# Patient Record
Sex: Male | Born: 1940 | Race: White | Hispanic: No | Marital: Married | Smoking: Former smoker
Health system: Southern US, Community
[De-identification: ages and names within clinical notes are randomized; demographics above are authoritative.]

## PROBLEM LIST (undated history)

## (undated) DIAGNOSIS — R197 Diarrhea, unspecified: Secondary | ICD-10-CM

## (undated) DIAGNOSIS — M549 Dorsalgia, unspecified: Secondary | ICD-10-CM

## (undated) DIAGNOSIS — F419 Anxiety disorder, unspecified: Secondary | ICD-10-CM

## (undated) DIAGNOSIS — G5139 Clonic hemifacial spasm, unspecified: Secondary | ICD-10-CM

## (undated) DIAGNOSIS — Z8719 Personal history of other diseases of the digestive system: Secondary | ICD-10-CM

## (undated) DIAGNOSIS — N400 Enlarged prostate without lower urinary tract symptoms: Secondary | ICD-10-CM

## (undated) DIAGNOSIS — I10 Essential (primary) hypertension: Secondary | ICD-10-CM

## (undated) DIAGNOSIS — K219 Gastro-esophageal reflux disease without esophagitis: Secondary | ICD-10-CM

## (undated) DIAGNOSIS — G8929 Other chronic pain: Secondary | ICD-10-CM

## (undated) DIAGNOSIS — G43909 Migraine, unspecified, not intractable, without status migrainosus: Secondary | ICD-10-CM

## (undated) HISTORY — PX: OTHER SURGICAL HISTORY: SHX169

## (undated) HISTORY — PX: ESOPHAGOGASTRODUODENOSCOPY: SHX1529

## (undated) HISTORY — DX: Benign prostatic hyperplasia without lower urinary tract symptoms: N40.0

## (undated) HISTORY — PX: TONSILLECTOMY: SUR1361

## (undated) HISTORY — PX: COLONOSCOPY: SHX174

## (undated) HISTORY — DX: Essential (primary) hypertension: I10

---

## 2000-11-03 ENCOUNTER — Ambulatory Visit (HOSPITAL_COMMUNITY): Admission: RE | Admit: 2000-11-03 | Discharge: 2000-11-03 | Payer: Self-pay | Admitting: Neurosurgery

## 2000-11-03 ENCOUNTER — Encounter: Payer: Self-pay | Admitting: Neurosurgery

## 2003-07-12 ENCOUNTER — Ambulatory Visit (HOSPITAL_COMMUNITY): Admission: RE | Admit: 2003-07-12 | Discharge: 2003-07-12 | Payer: Self-pay | Admitting: Neurology

## 2003-08-03 ENCOUNTER — Ambulatory Visit (HOSPITAL_COMMUNITY): Admission: RE | Admit: 2003-08-03 | Discharge: 2003-08-03 | Payer: Self-pay | Admitting: Neurology

## 2015-04-24 ENCOUNTER — Encounter (HOSPITAL_COMMUNITY): Payer: Self-pay | Admitting: Emergency Medicine

## 2015-04-24 ENCOUNTER — Telehealth: Payer: Self-pay | Admitting: General Practice

## 2015-04-24 ENCOUNTER — Emergency Department (HOSPITAL_COMMUNITY)
Admission: EM | Admit: 2015-04-24 | Discharge: 2015-04-24 | Disposition: A | Discharge status: Home / Self Care | Payer: Medicare Other | Attending: Emergency Medicine | Admitting: Emergency Medicine

## 2015-04-24 ENCOUNTER — Emergency Department (HOSPITAL_COMMUNITY): Payer: Medicare Other

## 2015-04-24 DIAGNOSIS — Z8679 Personal history of other diseases of the circulatory system: Secondary | ICD-10-CM | POA: Insufficient documentation

## 2015-04-24 DIAGNOSIS — Z87891 Personal history of nicotine dependence: Secondary | ICD-10-CM | POA: Diagnosis not present

## 2015-04-24 DIAGNOSIS — Z8669 Personal history of other diseases of the nervous system and sense organs: Secondary | ICD-10-CM | POA: Insufficient documentation

## 2015-04-24 DIAGNOSIS — R1010 Upper abdominal pain, unspecified: Secondary | ICD-10-CM | POA: Diagnosis present

## 2015-04-24 DIAGNOSIS — R109 Unspecified abdominal pain: Secondary | ICD-10-CM | POA: Insufficient documentation

## 2015-04-24 HISTORY — DX: Migraine, unspecified, not intractable, without status migrainosus: G43.909

## 2015-04-24 HISTORY — DX: Clonic hemifacial spasm, unspecified: G51.39

## 2015-04-24 LAB — CBC WITH DIFFERENTIAL/PLATELET
BASOS ABS: 0 10*3/uL (ref 0.0–0.1)
BASOS PCT: 0 %
EOS PCT: 2 %
Eosinophils Absolute: 0.2 10*3/uL (ref 0.0–0.7)
HCT: 45.2 % (ref 39.0–52.0)
Hemoglobin: 15.7 g/dL (ref 13.0–17.0)
Lymphocytes Relative: 10 %
Lymphs Abs: 1.4 10*3/uL (ref 0.7–4.0)
MCH: 32.6 pg (ref 26.0–34.0)
MCHC: 34.7 g/dL (ref 30.0–36.0)
MCV: 94 fL (ref 78.0–100.0)
MONO ABS: 2 10*3/uL — AB (ref 0.1–1.0)
MONOS PCT: 15 %
Neutro Abs: 9.6 10*3/uL — ABNORMAL HIGH (ref 1.7–7.7)
Neutrophils Relative %: 73 %
PLATELETS: 224 10*3/uL (ref 150–400)
RBC: 4.81 MIL/uL (ref 4.22–5.81)
RDW: 12.9 % (ref 11.5–15.5)
WBC: 13.2 10*3/uL — ABNORMAL HIGH (ref 4.0–10.5)

## 2015-04-24 LAB — COMPREHENSIVE METABOLIC PANEL
ALBUMIN: 3.6 g/dL (ref 3.5–5.0)
ALT: 16 U/L — ABNORMAL LOW (ref 17–63)
ANION GAP: 8 (ref 5–15)
AST: 21 U/L (ref 15–41)
Alkaline Phosphatase: 60 U/L (ref 38–126)
BILIRUBIN TOTAL: 1.1 mg/dL (ref 0.3–1.2)
BUN: 14 mg/dL (ref 6–20)
CO2: 25 mmol/L (ref 22–32)
Calcium: 9 mg/dL (ref 8.9–10.3)
Chloride: 101 mmol/L (ref 101–111)
Creatinine, Ser: 0.75 mg/dL (ref 0.61–1.24)
GFR calc Af Amer: >60 mL/min (ref >60)
Glucose, Bld: 103 mg/dL — ABNORMAL HIGH (ref 65–99)
POTASSIUM: 3.8 mmol/L (ref 3.5–5.1)
Sodium: 134 mmol/L — ABNORMAL LOW (ref 135–145)
TOTAL PROTEIN: 7.3 g/dL (ref 6.5–8.1)

## 2015-04-24 LAB — LIPASE, BLOOD: LIPASE: 16 U/L — AB (ref 22–51)

## 2015-04-24 MED ORDER — DICYCLOMINE HCL 20 MG PO TABS: ORAL_TABLET | ORAL | Status: DC

## 2015-04-24 NOTE — Telephone Encounter (Signed)
Request for medical records at [facility] has been made and they are aware it's ASAP.

## 2015-04-24 NOTE — ED Provider Notes (Signed)
CSN: [id number]     Arrival date & time 04/24/15  7619 History  By signing my name below, I, Tula Nakayama, attest that this documentation has been prepared under the direction and in the presence of Milton Ferguson, MD.  Electronically Signed: Tula Nakayama, ED Scribe. 04/24/2015. 10:45 AM.  Chief Complaint  Patient presents with  . Abdominal Pain   Patient is a 74 y.o. male presenting with abdominal pain. The history is provided by the patient. No language interpreter was used.  Abdominal Pain Pain location:  Epigastric Pain quality: aching   Pain radiates to:  Does not radiate Pain severity:  Moderate Duration:  12 weeks Timing:  Intermittent Progression:  Unchanged Chronicity:  New Relieved by:  Nothing Associated symptoms: no chest pain, no cough, no diarrhea, no fatigue and no hematuria     HPI Comments: Scott Owens is a 74 y.o. male who presents to the Emergency Department complaining of intermittent, moderate upper abdominal pain that started 3 months ago. He denies aggravating or relieving factors. Pt was admitted to [facility] 2 days ago for the same and had negative CT scans. He has not had an colonoscopy or endoscopy in over 10 years. Pt has been seen by his PCP who referred him to Dr. Laural Golden (GI), but he has not followed up. Pt takes Depakote, Prilosec and clonazepam daily. He takes Prilosec BID. Pt denies nausea, vomiting and diarrhea.  PCP Brigitte Pulse in [place]  Past Medical History  Diagnosis Date  . Migraines   . Facial spasm    Past Surgical History  Procedure Laterality Date  . Tonsillectomy     History reviewed. No pertinent family history. Social History  Substance Use Topics  . Smoking status: Former Research scientist (life sciences)  . Smokeless tobacco: None  . Alcohol Use: None   Review of Systems  Constitutional: Negative for appetite change and fatigue.  HENT: Negative for congestion, ear discharge and sinus pressure.   Eyes: Negative for discharge.  Respiratory: Negative for  cough.   Cardiovascular: Negative for chest pain.  Gastrointestinal: Positive for abdominal pain. Negative for diarrhea.  Genitourinary: Negative for frequency and hematuria.  Musculoskeletal: Negative for back pain.  Skin: Negative for rash.  Neurological: Negative for seizures and headaches.  Psychiatric/Behavioral: Negative for hallucinations.   Allergies  Sulfa antibiotics  Home Medications   Prior to Admission medications   Not on File   BP 135/89 mmHg  Pulse 96  Temp(Src) 98.2 F (36.8 C) (Oral)  Resp 16  Ht 6' (1.829 m)  Wt 215 lb (97.523 kg)  BMI 29.15 kg/m2  SpO2 95% Physical Exam  Constitutional: He is oriented to person, place, and time. He appears well-developed.  HENT:  Head: Normocephalic.  Eyes: Conjunctivae and EOM are normal. No scleral icterus.  Neck: Neck supple. No thyromegaly present.  Cardiovascular: Normal rate and regular rhythm.  Exam reveals no gallop and no friction rub.   No murmur heard. Pulmonary/Chest: Effort normal and breath sounds normal. No stridor. He has no wheezes. He has no rales. He exhibits no tenderness.  Abdominal: Soft. He exhibits no distension. There is no tenderness. There is no rebound.  Musculoskeletal: Normal range of motion. He exhibits no edema.  Lymphadenopathy:    He has no cervical adenopathy.  Neurological: He is oriented to person, place, and time. He exhibits normal muscle tone. Coordination normal.  Skin: No rash noted. No erythema.  Psychiatric: He has a normal mood and affect. His behavior is normal.  Nursing note and  vitals reviewed.   ED Course  Procedures   DIAGNOSTIC STUDIES: Oxygen Saturation is 95% on RA, adequate by my interpretation.    COORDINATION OF CARE: 10:45 AM Discussed treatment plan with pt which includes lab work and an abdominal x-ray. Pt agreed to plan.  Labs Review Labs Reviewed - No data to display  Imaging Review No results found. I have personally reviewed and evaluated  these images and lab results as part of my medical decision-making.   EKG Interpretation None      MDM   Final diagnoses:  None    Abdominal pain with mildly elevated white count otherwise unremarkable labs. Patient CT of abdomen yesterday that showed some diverticulosis. He will be sent home with Bentyl for abdominal cramping. I spoke with the GI M.D. and they will see the patient in 1 or 2 days.j The chart was scribed for me under my direct supervision.  I personally performed the history, physical, and medical decision making and all procedures in the evaluation of this patient.Milton Ferguson, MD 04/24/15 [phone]

## 2015-04-24 NOTE — ED Notes (Signed)
Pt reports mid abd pain x4 months, has had negative ct scans, [facility] with no significant findings. Last bm yesterday, denies n/v/d.

## 2015-04-24 NOTE — Telephone Encounter (Signed)
Per RMR patient needs to be seen ASAP.  I have him scheduled to see AS tomorrow morning.    Routing to Zeb Comfort to retrieve medical records(labs,imaging studies procedure/progress reports) from [facility].

## 2015-04-24 NOTE — Telephone Encounter (Signed)
Records from [facility] are on patient's chart

## 2015-04-24 NOTE — Discharge Instructions (Signed)
Follow up with dr. Sydell Axon next this week.  They should call you with an appointment.  If they do not call by tomorrow afternoon, then you call them

## 2015-04-24 NOTE — Telephone Encounter (Signed)
Noted  

## 2015-04-25 ENCOUNTER — Other Ambulatory Visit: Payer: Self-pay

## 2015-04-25 ENCOUNTER — Encounter: Payer: Self-pay | Admitting: Gastroenterology

## 2015-04-25 ENCOUNTER — Ambulatory Visit (INDEPENDENT_AMBULATORY_CARE_PROVIDER_SITE_OTHER): Payer: Medicare Other | Admitting: Gastroenterology

## 2015-04-25 VITALS — BP 140/82 | HR 101 | Temp 97.7°F | Ht 72.0 in | Wt 212.0 lb

## 2015-04-25 DIAGNOSIS — R109 Unspecified abdominal pain: Secondary | ICD-10-CM | POA: Insufficient documentation

## 2015-04-25 DIAGNOSIS — R1010 Upper abdominal pain, unspecified: Secondary | ICD-10-CM

## 2015-04-25 DIAGNOSIS — G8929 Other chronic pain: Secondary | ICD-10-CM

## 2015-04-25 DIAGNOSIS — R1011 Right upper quadrant pain: Principal | ICD-10-CM

## 2015-04-25 MED ORDER — PANTOPRAZOLE SODIUM 40 MG PO TBEC: 40.0000 mg | DELAYED_RELEASE_TABLET | Freq: Two times a day (BID) | ORAL | Status: DC

## 2015-04-25 MED ORDER — SUCRALFATE 1 GM/10ML PO SUSP: 1.0000 g | Freq: Three times a day (TID) | ORAL | Status: DC

## 2015-04-25 NOTE — Progress Notes (Signed)
CC'ED TO PCP 

## 2015-04-25 NOTE — Patient Instructions (Addendum)
Stop Prilosec. Start taking Protonix once each day, 30 minutes before breakfast and dinner. This was sent to your pharmacy.  Stop Bentyl.   Start taking Carafate suspension four times a day to help coat your stomach and esophagus.  Try to avoid any ibuprofen, excedrin, aleve, etc. Only use tylenol products for right now.   If you feel constipated, take one capful of Miralax as instructed on the bottle (this is over the counter) every evening as needed.   We have set you up for an upper endoscopy with Dr. Gala Romney.   You may drink boost or ensure between meals as a "snack". Drink plenty of water so your urine stays clear. I have attached a diet sheet that may be helpful right now.   Soft-Food Meal Plan A soft-food meal plan includes foods that are safe and easy to swallow. This meal plan typically is used:  If you are having trouble chewing or swallowing foods.  As a transition meal plan after only having had liquid meals for a long period. WHAT DO I NEED TO KNOW ABOUT THE SOFT-FOOD MEAL PLAN? A soft-food meal plan includes tender foods that are soft and easy to chew and swallow. In most cases, bite-sized pieces of food are easier to swallow. A bite-sized piece is about  inch or smaller. Foods in this plan do not need to be ground or pureed. Foods that are very hard, crunchy, or sticky should be avoided. Also, breads, cereals, yogurts, and desserts with nuts, seeds, or fruits should be avoided. WHAT FOODS CAN I EAT? Grains Rice and wild rice. Moist bread, dressing, pasta, and noodles. Well-moistened dry or cooked cereals, such as farina (cooked wheat cereal), oatmeal, or grits. Biscuits, breads, muffins, pancakes, and waffles that have been well moistened. Vegetables Shredded lettuce. Cooked, tender vegetables, including potatoes without skins. Vegetable juices. Broths or creamed soups made with vegetables that are not stringy or chewy. Strained tomatoes (without seeds). Fruits Canned or  well-cooked fruits. Soft (ripe), peeled fresh fruits, such as peaches, nectarines, kiwi, cantaloupe, honeydew melon, and watermelon (without seeds). Soft berries with small seeds, such as strawberries. Fruit juices (without pulp). Meats and Other Protein Sources Moist, tender, lean beef. Mutton. Lamb. Veal. Chicken. [place]. Liver. Ham. Fish without bones. Eggs. Dairy Milk, milk drinks, and cream. Plain cream cheese and cottage cheese. Plain yogurt. Sweets/Desserts Flavored gelatin desserts. Custard. Plain ice cream, frozen yogurt, sherbet, milk shakes, and malts. Plain cakes and cookies. Plain hard candy.  Other Butter, margarine (without trans fat), and cooking oils. Mayonnaise. Cream sauces. Mild spices, salt, and sugar. Syrup, molasses, honey, and jelly. The items listed above may not be a complete list of recommended foods or beverages. Contact your dietitian for more options. WHAT FOODS ARE NOT RECOMMENDED? Grains Dry bread, toast, crackers that have not been moistened. Coarse or dry cereals, such as bran, granola, and shredded wheat. Tough or chewy crusty breads, such as [place] bread or baguettes. Vegetables Corn. Raw vegetables except shredded lettuce. Cooked vegetables that are tough or stringy. Tough, crisp, fried potatoes and potato skins. Fruits Fresh fruits with skins or seeds or both, such as apples, pears, or grapes. Stringy, high-pulp fruits, such as papaya, pineapple, coconut, or mango. Fruit leather, fruit roll-ups, and all dried fruits. Meats and Other Protein Sources Sausages and hot dogs. Meats with gristle. Fish with bones. Nuts, seeds, and chunky peanut or other nut butters. Sweets/Desserts Cakes or cookies that are very dry or chewy.  The items listed above may not  be a complete list of foods and beverages to avoid. Contact your dietitian for more information.   This information is not intended to replace advice given to you by your health care provider. Make sure you  discuss any questions you have with your health care provider.   Document Released: 10/06/2007 Document Revised: 07/04/2013 Document Reviewed: 05/26/2013 Elsevier Interactive Patient Education [employer].

## 2015-04-25 NOTE — Assessment & Plan Note (Signed)
74 year old male with several month history of upper abdominal pain, now worsening, without any real exacerbating or relieving factors, with CT at [facility] as noted above. Reviewed with Dr. 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 In the setting of chronic NSAIDs and excedrin use, concern for gastritis, PUD, less likely biliary etiology. Last EGD in remote past with Schatzki's ring dilation per patient: I do not have these records, but reportedly it was done here at [facility]. Colonoscopy in remote past as well. Would start with an EGD first as soon as possible, change from Prilosec to Protonix, add Carafate, and take Miralax prn for mild constipation. Doubt constipation is playing that large of a role here but could certainly contribute. If persistent weight loss, abdominal pain, and negative EGD, would favor a CT with pancreatic protocol. Consider US abdomen if concern for biliary etiology, but it does not seem to be classic biliary presentation.   Proceed with upper endoscopy in the near future with Dr. Gala Romney. The risks, benefits, and alternatives have been discussed in detail with patient. They have stated understanding and desire to proceed.  Protonix BID Carafate Absolute avoidance of NSAIDs, aspirin powders Colonoscopy in future after current symptoms addressed Consider further imaging (US abdomen vs CT pancreatic protocol depending on clinical presentation, EGD findings) Will attempt to request last colonoscopy/EGD reports from medical records at [facility]

## 2015-04-25 NOTE — Progress Notes (Signed)
Primary Care Physician:  Monico Blitz, MD Primary Gastroenterologist:  Dr. Gala Romney   Chief Complaint  Patient presents with  . Abdominal Pain    HPI:   Scott Owens is a 73 y.o. male presenting today at the request of the ED secondary to worsening abdominal pain for the past 3 months. He has been seen at [facility] and hospitalized around the beginning of October, then discharged after 24-48 hours. No interventions completed. CT with extensive sigmoid colonic diverticulosis but no diverticulitis. Multiple low-attenuation lesions with liver are stable when compared to prior CTs, favored to represent cysts, no hepatobiliary dilation. Lipase 22, H/H 16.5/49.3. Reviewed CT with Dr. 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    3 months of upper abdominal pain, worsening. Admitted at [facility] recently.  Discharged from [facility] Monday night. Notes large amount of pressure in abdomen. Woke up from sleep at 2am. Went back to ED here at [facility]. History of chronic headaches, has one now. Seen at [facility] every 4 months with botox injections left face due to spasms. Saturday ate stew, then started having pain thereafter. Unable to really pinpoint exacerbating factors. No esophageal dysphagia. N/V X2 while admitted at [facility], otherwise resolved. Had diarrhea yesterday evening. Gave Bentyl for cramping from emergency room. No abdominal pain currently. Feels extremely weak. Has lost 7 lbs since last Saturday due to decreased intake. No melena. No hematochezia. Prilosec BID chronically.  Ibuprofen for chronic headaches, excedrin migraine if severe pain. States he used to have a BM every morning, then noticed he was not as regular.   Reports history of Schatzki's ring, having an EGD in remote past by Dr. Gala Romney. These records are not in epic, so  likely in remote past.   Past Medical History  Diagnosis Date  . Migraines   . Facial spasm     Past Surgical History  Procedure Laterality Date  . Tonsillectomy    . Right ear surgery    . Colonoscopy      remote past by Dr. Gala Romney  . Esophagogastroduodenoscopy      remote past by Dr. Gala Romney     Current Outpatient Prescriptions  Medication Sig Dispense Refill  . acetaminophen (TYLENOL) 500 MG tablet Take 1,000 mg by mouth every 6 (six) hours as needed for headache.    Marland Kitchen acidophilus (RISAQUAD) CAPS capsule Take 1 capsule by mouth daily.    . clonazePAM (KLONOPIN) 0.5 MG tablet Take 0.5 mg by mouth 2 (two) times daily as needed for anxiety (sleep).    Marland Kitchen dicyclomine (BENTYL) 20 MG tablet Take one every 6-8 hours for abdominal cramps 20 tablet 0  . divalproex (DEPAKOTE) 500 MG DR tablet Take 500 mg by mouth daily.    . fluticasone (FLONASE) 50 MCG/ACT nasal spray Place 2 sprays into both nostrils daily as needed for allergies or rhinitis.    Marland Kitchen omeprazole (PRILOSEC) 20 MG capsule Take 20 mg by mouth 2 (two) times daily before a meal.    . OnabotulinumtoxinA (BOTOX IJ) Inject as directed every 4 (four) months. For migraines.    . triamcinolone cream (KENALOG) 0.1 %     . pantoprazole (PROTONIX) 40 MG tablet Take 1 tablet (40 mg total) by mouth 2 (two) times daily before a meal. 60 tablet 3  . sucralfate (CARAFATE) 1 GM/10ML suspension Take 10  mLs (1 g total) by mouth 4 (four) times daily -  with meals and at bedtime. 420 mL 0   No current facility-administered medications for this visit.    Allergies as of 04/25/2015 - Review Complete 04/25/2015  Allergen Reaction Noted  . Sulfa antibiotics Other (See Comments) 04/24/2015  . Latex Rash 04/24/2015    Family History  Problem Relation Age of Onset  . Colon cancer Neg Hx     Social History   Social History  . Marital Status: Married    Spouse Name: N/A  . Number of Children: N/A  . Years of Education: N/A   Occupational  History  . Not on file.   Social History Main Topics  . Smoking status: Former Research scientist (life sciences)  . Smokeless tobacco: Not on file  . Alcohol Use: No  . Drug Use: No  . Sexual Activity: Not on file   Other Topics Concern  . Not on file   Social History Narrative    Review of Systems: Gen: see HPI CV: Denies chest pain, heart palpitations, peripheral edema, syncope.  Resp: Denies shortness of breath at rest or with exertion. Denies wheezing or cough.  GI: see HPI  GU : Denies urinary burning, urinary frequency, urinary hesitancy MS: Denies joint pain, muscle weakness, cramps, or limitation of movement.  Derm: Denies rash, itching, dry skin Psych: +anxiety  Heme: Denies bruising, bleeding, and enlarged lymph nodes.  Physical Exam: BP 140/82 mmHg  Pulse 101  Temp(Src) 97.7 F (36.5 C)  Ht 6' (1.829 m)  Wt 212 lb (96.163 kg)  BMI 28.75 kg/m2 General:   Alert and oriented. Pleasant and cooperative. Well-nourished and well-developed.  Head:  Normocephalic and atraumatic. Eyes:  Without icterus, sclera clear and conjunctiva pink.  Ears:  Mild HOH  Nose:  No deformity, discharge,  or lesions. Mouth:  No deformity or lesions, oral mucosa pink.  Lungs:  Clear to auscultation bilaterally. No wheezes, rales, or rhonchi. No distress.  Heart:  S1, S2 present without murmurs appreciated.  Abdomen:  +BS, soft, mild TTP upper abdomen and non-distended. No HSM noted. No guarding or rebound. No masses appreciated.  Rectal:  Deferred  Msk:  Symmetrical without gross deformities. Normal posture. Extremities:  Without edema. Neurologic:  Alert and  oriented x4;  grossly normal neurologically. Psych:  Alert and cooperative. Normal mood and affect.

## 2015-04-29 ENCOUNTER — Ambulatory Visit (HOSPITAL_COMMUNITY)
Admission: RE | Admit: 2015-04-29 | Discharge: 2015-04-29 | Disposition: A | Discharge status: Home / Self Care | Payer: Medicare Other | Source: Ambulatory Visit | Attending: Internal Medicine | Admitting: Internal Medicine

## 2015-04-29 ENCOUNTER — Other Ambulatory Visit: Payer: Self-pay

## 2015-04-29 ENCOUNTER — Encounter (HOSPITAL_COMMUNITY)
Admission: RE | Disposition: A | Discharge status: Home / Self Care | Payer: Self-pay | Source: Ambulatory Visit | Attending: Internal Medicine

## 2015-04-29 ENCOUNTER — Encounter (HOSPITAL_COMMUNITY): Payer: Self-pay | Admitting: *Deleted

## 2015-04-29 ENCOUNTER — Telehealth: Payer: Self-pay

## 2015-04-29 DIAGNOSIS — Z87891 Personal history of nicotine dependence: Secondary | ICD-10-CM | POA: Diagnosis not present

## 2015-04-29 DIAGNOSIS — Q394 Esophageal web: Secondary | ICD-10-CM | POA: Diagnosis not present

## 2015-04-29 DIAGNOSIS — R1010 Upper abdominal pain, unspecified: Secondary | ICD-10-CM | POA: Diagnosis present

## 2015-04-29 DIAGNOSIS — R109 Unspecified abdominal pain: Secondary | ICD-10-CM

## 2015-04-29 DIAGNOSIS — R1310 Dysphagia, unspecified: Secondary | ICD-10-CM | POA: Insufficient documentation

## 2015-04-29 DIAGNOSIS — K449 Diaphragmatic hernia without obstruction or gangrene: Secondary | ICD-10-CM | POA: Diagnosis not present

## 2015-04-29 DIAGNOSIS — R1011 Right upper quadrant pain: Secondary | ICD-10-CM

## 2015-04-29 DIAGNOSIS — K222 Esophageal obstruction: Secondary | ICD-10-CM | POA: Insufficient documentation

## 2015-04-29 DIAGNOSIS — G8929 Other chronic pain: Secondary | ICD-10-CM

## 2015-04-29 HISTORY — DX: Diarrhea, unspecified: R19.7

## 2015-04-29 HISTORY — PX: ESOPHAGOGASTRODUODENOSCOPY: SHX5428

## 2015-04-29 SURGERY — EGD (ESOPHAGOGASTRODUODENOSCOPY)
Anesthesia: Moderate Sedation

## 2015-04-29 MED ORDER — LIDOCAINE VISCOUS 2 % MT SOLN
OROMUCOSAL | Status: AC
  Filled 2015-04-29: qty 15

## 2015-04-29 MED ORDER — MIDAZOLAM HCL 5 MG/5ML IJ SOLN
INTRAMUSCULAR | Status: DC | PRN
  Administered 2015-04-29: 1 mg via INTRAVENOUS
  Administered 2015-04-29: 2 mg via INTRAVENOUS
  Administered 2015-04-29: 1 mg via INTRAVENOUS
  Administered 2015-04-29: 2 mg via INTRAVENOUS
  Administered 2015-04-29: 1 mg via INTRAVENOUS

## 2015-04-29 MED ORDER — MIDAZOLAM HCL 5 MG/5ML IJ SOLN
INTRAMUSCULAR | Status: AC
  Filled 2015-04-29: qty 10

## 2015-04-29 MED ORDER — MEPERIDINE HCL 100 MG/ML IJ SOLN
INTRAMUSCULAR | Status: DC | PRN
  Administered 2015-04-29: 50 mg via INTRAVENOUS
  Administered 2015-04-29 (×2): 25 mg via INTRAVENOUS

## 2015-04-29 MED ORDER — SODIUM CHLORIDE 0.9 % IV SOLN
INTRAVENOUS | Status: DC
Start: 2015-04-29 — End: 2015-04-29
  Administered 2015-04-29: 13:00:00 via INTRAVENOUS

## 2015-04-29 MED ORDER — MEPERIDINE HCL 100 MG/ML IJ SOLN
INTRAMUSCULAR | Status: AC
  Filled 2015-04-29: qty 2

## 2015-04-29 MED ORDER — ONDANSETRON HCL 4 MG/2ML IJ SOLN
INTRAMUSCULAR | Status: DC | PRN
  Administered 2015-04-29: 4 mg via INTRAVENOUS

## 2015-04-29 MED ORDER — ONDANSETRON HCL 4 MG/2ML IJ SOLN
INTRAMUSCULAR | Status: AC
  Filled 2015-04-29: qty 2

## 2015-04-29 MED ORDER — SIMETHICONE 40 MG/0.6ML PO SUSP
ORAL | Status: DC | PRN
  Administered 2015-04-29: 14:00:00

## 2015-04-29 NOTE — Interval H&P Note (Signed)
History and Physical Interval Note:  04/29/2015 1:57 PM  Scott Owens  has presented today for surgery, with the diagnosis of upper abd pain  The various methods of treatment have been discussed with the patient and family. After consideration of risks, benefits and other options for treatment, the patient has consented to  Procedure(s) with comments: ESOPHAGOGASTRODUODENOSCOPY (EGD) (N/A) - 745 - moved to 2:30 - Candy to notify pt as a surgical intervention .  The patient's history has been reviewed, patient examined, no change in status, stable for surgery.  I have reviewed the patient's chart and labs.  Questions were answered to the patient's satisfaction.     Manus Rudd  Patient does note recurrent insidious esophageal dysphagia to solids in contradistinction to the documentation in the history and physical.. States he had a Schatzki's ring dilated many years ago by me. This helped tremendously. Has noted insidious recurrence. No change otherwise.  Plan today for EGD with possible esophageal dilation as feasible/appropriate.  The risks, benefits, limitations, alternatives and imponderables have been reviewed with the patient. Potential for esophageal dilation, biopsy, etc. have also been reviewed.  Questions have been answered. All parties agreeable.

## 2015-04-29 NOTE — Op Note (Signed)
[facility] [address] [place], [address]   ENDOSCOPY PROCEDURE REPORT  PATIENT: Scott, Owens  MR#: [id number] BIRTHDATE: 08-06-1940 , 74  yrs. old GENDER: male ENDOSCOPIST: R.  Garfield Cornea, MD FACP FACG REFERRED BY:  Monico Blitz, M.D. PROCEDURE DATE:  May 08, 2015 PROCEDURE:  EGD, diagnostic and Maloney dilation of esophagus INDICATIONS:  upper abdominal pain; esophageal dysphagia. MEDICATIONS: Versed 7 mg IV and Demerol 100 mg IV in divided doses. Zofran 4 mg IV.  Xylocaine gel orally and Upper abdominal pain; solid food dysphagia ASA CLASS:      Class II  CONSENT: The risks, benefits, limitations, alternatives and imponderables have been discussed.  The potential for biopsy, esophogeal dilation, etc. have also been reviewed.  Questions have been answered.  All parties agreeable.  Please see the history and physical in the medical record for more information.  DESCRIPTION OF PROCEDURE: After the risks benefits and alternatives of the procedure were thoroughly explained, informed consent was obtained.  The EG-[id number] ([id number]) endoscope was introduced through the mouth and advanced to the second portion of the duodenum , limited by Without limitations. The instrument was slowly withdrawn as the mucosa was fully examined. Estimated blood loss is zero unless otherwise noted in this procedure report.    Prominent Schatzki's ring; otherwise normal esophagus. Small hiatal hernia.  Normal gastric mucosa. Stomach empty.       Retroflexed views revealed as previously described.  Patent pylorus. Normal-appearing first and second portion of the duodenum. Scope was withdrawn. A 56 French Maloney dilators passed full insertion easily. They look back revealed the ring and been partially dilated. Subsequently, a 50 [place] Maloney dilators passed a full insertion easily. A look back after this maneuver revealed the ring and been nicely disrupted with minimal bleeding  and without apparent complication.   The scope was then withdrawn from the patient and the procedure completed.  COMPLICATIONS: EBL 5 mL ENDOSCOPIC IMPRESSION: Prominent Schatzki's ring?"status post dilation as described above. Hiatal hernia..  No endoscopic explanation for patient's recent upper abdominal pain, however, found today.  RECOMMENDATIONS: Continue omeprazole 20 mg twice daily. Proceed with a gallbladder ultrasound. Follow-up in her office in several weeks to set up a colonoscopy.  REPEAT EXAM:  eSigned:  R. Garfield Cornea, MD Rosalita Chessman [facility] 08-May-2015 2:37 PM    CC:  CPT CODES: ICD CODES:  The ICD and CPT codes recommended by this software are interpretations from the data that the clinical staff has captured with the software.  The verification of the translation of this report to the ICD and CPT codes and modifiers is the sole responsibility of the health care institution and practicing physician where this report was generated.  [employer] will not be held responsible for the validity of the ICD and CPT codes included on this report.  AMA assumes no liability for data contained or not contained herein. CPT is a Designer, television/film set of the [employer].  PATIENT NAME:  Scott, Owens MR#: [id number]

## 2015-04-29 NOTE — H&P (View-Only) (Signed)
Primary Care Physician:  Monico Blitz, MD Primary Gastroenterologist:  Dr. Gala Romney   Chief Complaint  Patient presents with  . Abdominal Pain    HPI:   Scott Owens is a 74 y.o. male presenting today at the request of the ED secondary to worsening abdominal pain for the past 3 months. He has been seen at [facility] and hospitalized around the beginning of October, then discharged after 24-48 hours. No interventions completed. CT with extensive sigmoid colonic diverticulosis but no diverticulitis. Multiple low-attenuation lesions with liver are stable when compared to prior CTs, favored to represent cysts, no hepatobiliary dilation. Lipase 22, H/H 16.5/49.3. Reviewed CT with Dr. 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    3 months of upper abdominal pain, worsening. Admitted at [facility] recently.  Discharged from [facility] Monday night. Notes large amount of pressure in abdomen. Woke up from sleep at 2am. Went back to ED here at [facility]. History of chronic headaches, has one now. Seen at [facility] every 4 months with botox injections left face due to spasms. Saturday ate stew, then started having pain thereafter. Unable to really pinpoint exacerbating factors. No esophageal dysphagia. N/V X2 while admitted at [facility], otherwise resolved. Had diarrhea yesterday evening. Gave Bentyl for cramping from emergency room. No abdominal pain currently. Feels extremely weak. Has lost 7 lbs since last Saturday due to decreased intake. No melena. No hematochezia. Prilosec BID chronically.  Ibuprofen for chronic headaches, excedrin migraine if severe pain. States he used to have a BM every morning, then noticed he was not as regular.   Reports history of Schatzki's ring, having an EGD in remote past by Dr. Gala Romney. These records are not in epic, so  likely in remote past.   Past Medical History  Diagnosis Date  . Migraines   . Facial spasm     Past Surgical History  Procedure Laterality Date  . Tonsillectomy    . Right ear surgery    . Colonoscopy      remote past by Dr. Gala Romney  . Esophagogastroduodenoscopy      remote past by Dr. Gala Romney     Current Outpatient Prescriptions  Medication Sig Dispense Refill  . acetaminophen (TYLENOL) 500 MG tablet Take 1,000 mg by mouth every 6 (six) hours as needed for headache.    Marland Kitchen acidophilus (RISAQUAD) CAPS capsule Take 1 capsule by mouth daily.    . clonazePAM (KLONOPIN) 0.5 MG tablet Take 0.5 mg by mouth 2 (two) times daily as needed for anxiety (sleep).    Marland Kitchen dicyclomine (BENTYL) 20 MG tablet Take one every 6-8 hours for abdominal cramps 20 tablet 0  . divalproex (DEPAKOTE) 500 MG DR tablet Take 500 mg by mouth daily.    . fluticasone (FLONASE) 50 MCG/ACT nasal spray Place 2 sprays into both nostrils daily as needed for allergies or rhinitis.    Marland Kitchen omeprazole (PRILOSEC) 20 MG capsule Take 20 mg by mouth 2 (two) times daily before a meal.    . OnabotulinumtoxinA (BOTOX IJ) Inject as directed every 4 (four) months. For migraines.    . triamcinolone cream (KENALOG) 0.1 %     . pantoprazole (PROTONIX) 40 MG tablet Take 1 tablet (40 mg total) by mouth 2 (two) times daily before a meal. 60 tablet 3  . sucralfate (CARAFATE) 1 GM/10ML suspension Take 10  mLs (1 g total) by mouth 4 (four) times daily -  with meals and at bedtime. 420 mL 0   No current facility-administered medications for this visit.    Allergies as of 04/25/2015 - Review Complete 04/25/2015  Allergen Reaction Noted  . Sulfa antibiotics Other (See Comments) 04/24/2015  . Latex Rash 04/24/2015    Family History  Problem Relation Age of Onset  . Colon cancer Neg Hx     Social History   Social History  . Marital Status: Married    Spouse Name: N/A  . Number of Children: N/A  . Years of Education: N/A   Occupational  History  . Not on file.   Social History Main Topics  . Smoking status: Former Research scientist (life sciences)  . Smokeless tobacco: Not on file  . Alcohol Use: No  . Drug Use: No  . Sexual Activity: Not on file   Other Topics Concern  . Not on file   Social History Narrative    Review of Systems: Gen: see HPI CV: Denies chest pain, heart palpitations, peripheral edema, syncope.  Resp: Denies shortness of breath at rest or with exertion. Denies wheezing or cough.  GI: see HPI  GU : Denies urinary burning, urinary frequency, urinary hesitancy MS: Denies joint pain, muscle weakness, cramps, or limitation of movement.  Derm: Denies rash, itching, dry skin Psych: +anxiety  Heme: Denies bruising, bleeding, and enlarged lymph nodes.  Physical Exam: BP 140/82 mmHg  Pulse 101  Temp(Src) 97.7 F (36.5 C)  Ht 6' (1.829 m)  Wt 212 lb (96.163 kg)  BMI 28.75 kg/m2 General:   Alert and oriented. Pleasant and cooperative. Well-nourished and well-developed.  Head:  Normocephalic and atraumatic. Eyes:  Without icterus, sclera clear and conjunctiva pink.  Ears:  Mild HOH  Nose:  No deformity, discharge,  or lesions. Mouth:  No deformity or lesions, oral mucosa pink.  Lungs:  Clear to auscultation bilaterally. No wheezes, rales, or rhonchi. No distress.  Heart:  S1, S2 present without murmurs appreciated.  Abdomen:  +BS, soft, mild TTP upper abdomen and non-distended. No HSM noted. No guarding or rebound. No masses appreciated.  Rectal:  Deferred  Msk:  Symmetrical without gross deformities. Normal posture. Extremities:  Without edema. Neurologic:  Alert and  oriented x4;  grossly normal neurologically. Psych:  Alert and cooperative. Normal mood and affect.

## 2015-04-29 NOTE — Telephone Encounter (Signed)
Called pt to notify him of he is [place] on 05/03/2015 @ 10:15am. LMOM to be fasting and about date and time

## 2015-04-29 NOTE — Discharge Instructions (Signed)
EGD Discharge instructions Please read the instructions outlined below and refer to this sheet in the next few weeks. These discharge instructions provide you with general information on caring for yourself after you leave the hospital. Your doctor may also give you specific instructions. While your treatment has been planned according to the most current medical practices available, unavoidable complications occasionally occur. If you have any problems or questions after discharge, please call your doctor. ACTIVITY  You may resume your regular activity but move at a slower pace for the next 24 hours.   Take frequent rest periods for the next 24 hours.   Walking will help expel (get rid of) the air and reduce the bloated feeling in your abdomen.   No driving for 24 hours (because of the anesthesia (medicine) used during the test).   You may shower.   Do not sign any important legal documents or operate any machinery for 24 hours (because of the anesthesia used during the test).  NUTRITION  Drink plenty of fluids.   You may resume your normal diet.   Begin with a light meal and progress to your normal diet.   Avoid alcoholic beverages for 24 hours or as instructed by your caregiver.  MEDICATIONS  You may resume your normal medications unless your caregiver tells you otherwise.  WHAT YOU CAN EXPECT TODAY  You may experience abdominal discomfort such as a feeling of fullness or gas pains.  FOLLOW-UP  Your doctor will discuss the results of your test with you.  SEEK IMMEDIATE MEDICAL ATTENTION IF ANY OF THE FOLLOWING OCCUR:  Excessive nausea (feeling sick to your stomach) and/or vomiting.   Severe abdominal pain and distention (swelling).   Trouble swallowing.   Temperature over 101 F (37.8 C).   Rectal bleeding or vomiting of blood.    Continue omeprazole 20 mg twice daily  Gallbladder ultrasound on another day to further evaluate your upper abdominal pain  Office  visit with [place] in 6-8 weeks to set up a colonoscopy  Further recommendations to follow.

## 2015-05-02 ENCOUNTER — Encounter (HOSPITAL_COMMUNITY): Payer: Self-pay | Admitting: Internal Medicine

## 2015-05-03 ENCOUNTER — Ambulatory Visit (HOSPITAL_COMMUNITY)
Admission: RE | Admit: 2015-05-03 | Discharge: 2015-05-03 | Disposition: A | Discharge status: Home / Self Care | Payer: Medicare Other | Source: Ambulatory Visit | Attending: Internal Medicine | Admitting: Internal Medicine

## 2015-05-03 DIAGNOSIS — R109 Unspecified abdominal pain: Secondary | ICD-10-CM | POA: Diagnosis not present

## 2015-05-03 DIAGNOSIS — K8020 Calculus of gallbladder without cholecystitis without obstruction: Secondary | ICD-10-CM | POA: Insufficient documentation

## 2015-05-08 ENCOUNTER — Telehealth: Payer: Self-pay | Admitting: Internal Medicine

## 2015-05-08 NOTE — Telephone Encounter (Signed)
I spoke with the pt.  

## 2015-05-08 NOTE — Telephone Encounter (Signed)
Pt called to see if his CT results were back. I told him that I would send a phone note to the nurse and she would be calling if they were available. [phone]

## 2015-05-09 ENCOUNTER — Other Ambulatory Visit: Payer: Self-pay

## 2015-05-09 DIAGNOSIS — K8020 Calculus of gallbladder without cholecystitis without obstruction: Secondary | ICD-10-CM

## 2015-05-17 NOTE — H&P (Signed)
  NTS SOAP Note  Vital Signs:  Vitals as of: 56/08/5636: Systolic 937: Diastolic 90: Heart Rate 85: Temp 98.56F: Height 19ft 11in: Weight 208Lbs 0 Ounces: BMI 29.01  BMI : 29.01 kg/m2  Subjective: This 74 year old male presents for of biliary colic.  Has been having upper abdominal pain, nausea, and fatty food intolerance for some time now.  Seems to be worsening.  Meds not helpful.  U/S show cholelithiasis, normal common bile duct.  No fever, chills, jaundice.  Review of Symptoms:  Constitutional:unremarkable   dizzy spells Eyes:unremarkable   Nose/Mouth/Throat:unremarkable Cardiovascular:  unremarkable Respiratory:unremarkable Gastrointestinabdominal pain Genitourinary:frequency back pain Skin:unremarkable Hematolgic/Lymphatic:unremarkable   Allergic/Immunologic:unremarkable   Past Medical History:  Reviewed  Past Medical History  Surgical History: tonsillectomy Medical Problems: migraines, facial spasm Allergies: sulfa, latex Medications: protonix, carafate, depakote, klonopin, bentyl, carafate   Social History:Reviewed  Social History  Preferred Language: English Race:  White Ethnicity: Not Hispanic / Latino Age: 66 year Marital Status:  M Alcohol: no   Smoking Status: Never smoker reviewed on 05/16/2015 Functional Status reviewed on 05/16/2015 ------------------------------------------------ Bathing: Normal Cooking: Normal Dressing: Normal Driving: Normal Eating: Normal Managing Meds: Normal Oral Care: Normal Shopping: Normal Toileting: Normal Transferring: Normal Walking: Normal Cognitive Status reviewed on 05/16/2015 ------------------------------------------------ Attention: Normal Decision Making: Normal Language: Normal Memory: Normal Motor: Normal Perception: Normal Problem Solving: Normal Visual and Spatial: Normal   Family History:Reviewed  Family Health History Mother, Deceased; Ovarian cancer;  Father      Objective Information: General:Well appearing, well nourished in no distress. Heart:RRR, no murmur or gallop.  Normal S1, S2.  No S3, S4.  Lungs:  CTA bilaterally, no wheezes, rhonchi, rales.  Breathing unlabored. Abdomen:Soft, tender in right upperq quadrant to palpation, ND, no HSM, no masses.  Assessment:Biliary colic, cholelithiasis  Diagnoses: 574.20  K80.20 Gallstone (Calculus of gallbladder without cholecystitis without obstruction)  Procedures: 34287 - OFFICE OUTPATIENT NEW 30 MINUTES    Plan:  Scheduled for laparoscopic cholecystectomy on 05/24/15.   Patient Education:Alternative treatments to surgery were discussed with patient (and family).  Risks and benefits  of procedure including bleeding, infection, hepatobiliary injury, and the possibility of an open procedure were fully explained to the patient (and family) who gave informed consent. Patient/family questions were addressed.  Follow-up:Pending Surgery

## 2015-05-20 NOTE — Patient Instructions (Signed)
Your procedure is scheduled on:05/24/2015   Report to [facility] at   9:30  AM.  Call this number if you have problems the morning of surgery: [phone]   Remember:   Do not drink or eat food:After Midnight.  :  Take these medicines the morning of surgery with A SIP OF WATER: Klonopin, Depakote and protonix   Do not wear jewelry, make-up or nail polish.  Do not wear lotions, powders, or perfumes. You may wear deodorant.  Do not shave 48 hours prior to surgery. Men may shave face and neck.  Do not bring valuables to the hospital.  Contacts, dentures or bridgework may not be worn into surgery.  Leave suitcase in the car. After surgery it may be brought to your room.  For patients admitted to the hospital, checkout time is 11:00 AM the day of discharge.   Patients discharged the day of surgery will not be allowed to drive home.    Special Instructions: Shower using CHG night before surgery and shower the day of surgery use CHG.  Use special wash - you have one bottle of CHG for all showers.  You should use approximately 1/2 of the bottle for each shower. Laparoscopic Cholecystectomy, Care After Refer to this sheet in the next few weeks. These instructions provide you with information about caring for yourself after your procedure. Your health care provider may also give you more specific instructions. Your treatment has been planned according to current medical practices, but problems sometimes occur. Call your health care provider if you have any problems or questions after your procedure. WHAT TO EXPECT AFTER THE PROCEDURE After your procedure, it is common to have:  Pain at your incision sites. You will be given pain medicines to control your pain.  Mild nausea or vomiting. This should improve after the first 24 hours.  Bloating and possible shoulder pain from the gas that was used during the procedure. This will improve after the first 24 hours. HOME CARE INSTRUCTIONS Incision  Care  Follow instructions from your health care provider about how to take care of your incisions. Make sure you:  Wash your hands with soap and water before you change your bandage (dressing). If soap and water are not available, use hand sanitizer.  Change your dressing as told by your health care provider.  Leave stitches (sutures), skin glue, or adhesive strips in place. These skin closures may need to be in place for 2 weeks or longer. If adhesive strip edges start to loosen and curl up, you may trim the loose edges. Do not remove adhesive strips completely unless your health care provider tells you to do that.  Do not take baths, swim, or use a hot tub until your health care provider approves. Ask your health care provider if you can take showers. You may only be allowed to take sponge baths for bathing. General Instructions  Take over-the-counter and prescription medicines only as told by your health care provider.  Do not drive or operate heavy machinery while taking prescription pain medicine.  Return to your normal diet as told by your health care provider.  Do not lift anything that is heavier than 10 lb (4.5 kg).  Do not play contact sports for one week or until your health care provider approves. SEEK MEDICAL CARE IF:   You have redness, swelling, or pain at the site of your incision.  You have fluid, blood, or pus coming from your incision.  You notice a  bad smell coming from your incision area.  Your surgical incisions break open.  You have a fever. SEEK IMMEDIATE MEDICAL CARE IF:  You develop a rash.  You have difficulty breathing.  You have chest pain.  You have increasing pain in your shoulders (shoulder strap areas).  You faint or have dizzy episodes while you are standing.  You have severe pain in your abdomen.  You have nausea or vomiting that lasts for more than one day.   This information is not intended to replace advice given to you by your  health care provider. Make sure you discuss any questions you have with your health care provider.   Document Released: 06/29/2005 Document Revised: 03/20/2015 Document Reviewed: 02/08/2013 Elsevier Interactive Patient Education 2016 [place] Anesthesia, Adult, Care After Refer to this sheet in the next few weeks. These instructions provide you with information on caring for yourself after your procedure. Your health care provider may also give you more specific instructions. Your treatment has been planned according to current medical practices, but problems sometimes occur. Call your health care provider if you have any problems or questions after your procedure. WHAT TO EXPECT AFTER THE PROCEDURE After the procedure, it is typical to experience:  Sleepiness.  Nausea and vomiting. HOME CARE INSTRUCTIONS  For the first 24 hours after general anesthesia:  Have a responsible person with you.  Do not drive a car. If you are alone, do not take public transportation.  Do not drink alcohol.  Do not take medicine that has not been prescribed by your health care provider.  Do not sign important papers or make important decisions.  You may resume a normal diet and activities as directed by your health care provider.  Change bandages (dressings) as directed.  If you have questions or problems that seem related to general anesthesia, call the hospital and ask for the anesthetist or anesthesiologist on call. SEEK MEDICAL CARE IF:  You have nausea and vomiting that continue the day after anesthesia.  You develop a rash. SEEK IMMEDIATE MEDICAL CARE IF:   You have difficulty breathing.  You have chest pain.  You have any allergic problems.   This information is not intended to replace advice given to you by your health care provider. Make sure you discuss any questions you have with your health care provider.   Document Released: 10/05/2000 Document Revised: 07/20/2014  Document Reviewed: 10/28/2011 Elsevier Interactive Patient Education [employer].

## 2015-05-21 ENCOUNTER — Encounter (HOSPITAL_COMMUNITY): Payer: Self-pay

## 2015-05-21 ENCOUNTER — Other Ambulatory Visit: Payer: Self-pay

## 2015-05-21 ENCOUNTER — Encounter (HOSPITAL_COMMUNITY)
Admission: RE | Admit: 2015-05-21 | Discharge: 2015-05-21 | Disposition: A | Discharge status: Home / Self Care | Payer: Medicare Other | Source: Ambulatory Visit | Attending: General Surgery | Admitting: General Surgery

## 2015-05-21 DIAGNOSIS — K8010 Calculus of gallbladder with chronic cholecystitis without obstruction: Secondary | ICD-10-CM | POA: Diagnosis present

## 2015-05-21 DIAGNOSIS — F419 Anxiety disorder, unspecified: Secondary | ICD-10-CM | POA: Diagnosis not present

## 2015-05-21 DIAGNOSIS — Z79899 Other long term (current) drug therapy: Secondary | ICD-10-CM | POA: Diagnosis not present

## 2015-05-21 DIAGNOSIS — Z87891 Personal history of nicotine dependence: Secondary | ICD-10-CM | POA: Diagnosis not present

## 2015-05-21 DIAGNOSIS — Z01810 Encounter for preprocedural cardiovascular examination: Secondary | ICD-10-CM | POA: Diagnosis not present

## 2015-05-21 DIAGNOSIS — Z01812 Encounter for preprocedural laboratory examination: Secondary | ICD-10-CM | POA: Diagnosis not present

## 2015-05-21 HISTORY — DX: Other chronic pain: G89.29

## 2015-05-21 HISTORY — DX: Dorsalgia, unspecified: M54.9

## 2015-05-21 HISTORY — DX: Personal history of other diseases of the digestive system: Z87.19

## 2015-05-21 HISTORY — DX: Anxiety disorder, unspecified: F41.9

## 2015-05-21 LAB — CBC WITH DIFFERENTIAL/PLATELET
BASOS ABS: 0 10*3/uL (ref 0.0–0.1)
BASOS PCT: 1 %
Eosinophils Absolute: 0.1 10*3/uL (ref 0.0–0.7)
Eosinophils Relative: 2 %
HEMATOCRIT: 44.2 % (ref 39.0–52.0)
HEMOGLOBIN: 14.8 g/dL (ref 13.0–17.0)
LYMPHS PCT: 32 %
Lymphs Abs: 2.3 10*3/uL (ref 0.7–4.0)
MCH: 32 pg (ref 26.0–34.0)
MCHC: 33.5 g/dL (ref 30.0–36.0)
MCV: 95.7 fL (ref 78.0–100.0)
MONO ABS: 0.8 10*3/uL (ref 0.1–1.0)
Monocytes Relative: 12 %
NEUTROS ABS: 3.9 10*3/uL (ref 1.7–7.7)
NEUTROS PCT: 53 %
Platelets: 199 10*3/uL (ref 150–400)
RBC: 4.62 MIL/uL (ref 4.22–5.81)
RDW: 13.3 % (ref 11.5–15.5)
WBC: 7.2 10*3/uL (ref 4.0–10.5)

## 2015-05-21 LAB — BASIC METABOLIC PANEL
ANION GAP: 6 (ref 5–15)
BUN: 11 mg/dL (ref 6–20)
CALCIUM: 9.3 mg/dL (ref 8.9–10.3)
CO2: 28 mmol/L (ref 22–32)
Chloride: 104 mmol/L (ref 101–111)
Creatinine, Ser: 0.82 mg/dL (ref 0.61–1.24)
GLUCOSE: 100 mg/dL — AB (ref 65–99)
POTASSIUM: 4.3 mmol/L (ref 3.5–5.1)
Sodium: 138 mmol/L (ref 135–145)

## 2015-05-21 LAB — HEPATIC FUNCTION PANEL
ALT: 15 U/L — AB (ref 17–63)
AST: 22 U/L (ref 15–41)
Albumin: 4 g/dL (ref 3.5–5.0)
Alkaline Phosphatase: 64 U/L (ref 38–126)
BILIRUBIN DIRECT: 0.1 mg/dL (ref 0.1–0.5)
BILIRUBIN INDIRECT: 0.3 mg/dL (ref 0.3–0.9)
TOTAL PROTEIN: 6.9 g/dL (ref 6.5–8.1)
Total Bilirubin: 0.4 mg/dL (ref 0.3–1.2)

## 2015-05-21 NOTE — Pre-Procedure Instructions (Signed)
Patient given information to sign up for my chart at home. 

## 2015-05-21 NOTE — Progress Notes (Signed)
   05/21/15 1344  OBSTRUCTIVE SLEEP APNEA  Have you ever been diagnosed with sleep apnea through a sleep study? No  Do you snore loudly (loud enough to be heard through closed doors)?  1  Do you often feel tired, fatigued, or sleepy during the daytime (such as falling asleep during driving or talking to someone)? 1  Has anyone observed you stop breathing during your sleep? 0  Do you have, or are you being treated for high blood pressure? 0  BMI more than 35 kg/m2? 0  Age > 50 (1-yes) 1  Neck circumference greater than:Male 16 inches or larger, Male 17inches or larger? 0  Male Gender (Yes=1) 1  Obstructive Sleep Apnea Score 4  Score 5 or greater  Results sent to PCP

## 2015-05-24 ENCOUNTER — Ambulatory Visit (HOSPITAL_COMMUNITY)
Admission: RE | Admit: 2015-05-24 | Discharge: 2015-05-24 | Disposition: A | Discharge status: Home / Self Care | Payer: Medicare Other | Source: Ambulatory Visit | Attending: General Surgery | Admitting: General Surgery

## 2015-05-24 ENCOUNTER — Ambulatory Visit (HOSPITAL_COMMUNITY): Payer: Medicare Other | Admitting: Anesthesiology

## 2015-05-24 ENCOUNTER — Encounter (HOSPITAL_COMMUNITY): Payer: Self-pay | Admitting: *Deleted

## 2015-05-24 ENCOUNTER — Encounter (HOSPITAL_COMMUNITY)
Admission: RE | Disposition: A | Discharge status: Home / Self Care | Payer: Self-pay | Source: Ambulatory Visit | Attending: General Surgery

## 2015-05-24 DIAGNOSIS — Z01810 Encounter for preprocedural cardiovascular examination: Secondary | ICD-10-CM | POA: Diagnosis not present

## 2015-05-24 DIAGNOSIS — K8010 Calculus of gallbladder with chronic cholecystitis without obstruction: Secondary | ICD-10-CM | POA: Diagnosis not present

## 2015-05-24 DIAGNOSIS — F419 Anxiety disorder, unspecified: Secondary | ICD-10-CM | POA: Insufficient documentation

## 2015-05-24 DIAGNOSIS — Z01812 Encounter for preprocedural laboratory examination: Secondary | ICD-10-CM | POA: Insufficient documentation

## 2015-05-24 DIAGNOSIS — Z87891 Personal history of nicotine dependence: Secondary | ICD-10-CM | POA: Insufficient documentation

## 2015-05-24 DIAGNOSIS — Z79899 Other long term (current) drug therapy: Secondary | ICD-10-CM | POA: Insufficient documentation

## 2015-05-24 HISTORY — PX: CHOLECYSTECTOMY: SHX55

## 2015-05-24 LAB — GLUCOSE, CAPILLARY
Glucose-Capillary: 85 mg/dL (ref 65–99)
Glucose-Capillary: 107 mg/dL — ABNORMAL HIGH (ref 65–99)

## 2015-05-24 SURGERY — LAPAROSCOPIC CHOLECYSTECTOMY
Anesthesia: General

## 2015-05-24 MED ORDER — HYDROCODONE-ACETAMINOPHEN 5-325 MG PO TABS: 1.0000 | ORAL_TABLET | Freq: Four times a day (QID) | ORAL | Status: AC | PRN

## 2015-05-24 MED ORDER — ROCURONIUM BROMIDE 50 MG/5ML IV SOLN
INTRAVENOUS | Status: AC
  Filled 2015-05-24: qty 2

## 2015-05-24 MED ORDER — FENTANYL CITRATE (PF) 100 MCG/2ML IJ SOLN
INTRAMUSCULAR | Status: DC | PRN
  Administered 2015-05-24 (×4): 25 ug via INTRAVENOUS

## 2015-05-24 MED ORDER — ONDANSETRON HCL 4 MG/2ML IJ SOLN
4.0000 mg | Freq: Once | INTRAMUSCULAR | Status: AC
  Administered 2015-05-24: 4 mg via INTRAVENOUS

## 2015-05-24 MED ORDER — EPHEDRINE SULFATE 50 MG/ML IJ SOLN
INTRAMUSCULAR | Status: AC
  Filled 2015-05-24: qty 1

## 2015-05-24 MED ORDER — FENTANYL CITRATE (PF) 100 MCG/2ML IJ SOLN
INTRAMUSCULAR | Status: AC
  Filled 2015-05-24: qty 2

## 2015-05-24 MED ORDER — SODIUM CHLORIDE 0.9 % IR SOLN
Status: DC | PRN
  Administered 2015-05-24: 3000 mL
  Administered 2015-05-24: 1000 mL

## 2015-05-24 MED ORDER — BUPIVACAINE HCL (PF) 0.5 % IJ SOLN
INTRAMUSCULAR | Status: AC
  Filled 2015-05-24: qty 30

## 2015-05-24 MED ORDER — KETOROLAC TROMETHAMINE 30 MG/ML IJ SOLN
30.0000 mg | Freq: Once | INTRAMUSCULAR | Status: AC
  Administered 2015-05-24: 30 mg via INTRAVENOUS

## 2015-05-24 MED ORDER — GLYCOPYRROLATE 0.2 MG/ML IJ SOLN
0.2000 mg | Freq: Once | INTRAMUSCULAR | Status: AC
  Administered 2015-05-24: 0.2 mg via INTRAVENOUS

## 2015-05-24 MED ORDER — KETOROLAC TROMETHAMINE 30 MG/ML IJ SOLN
INTRAMUSCULAR | Status: AC
  Filled 2015-05-24: qty 1

## 2015-05-24 MED ORDER — ONDANSETRON HCL 4 MG/2ML IJ SOLN: 4.0000 mg | Freq: Once | INTRAMUSCULAR | Status: DC | PRN

## 2015-05-24 MED ORDER — NEOSTIGMINE METHYLSULFATE 10 MG/10ML IV SOLN
INTRAVENOUS | Status: AC
  Filled 2015-05-24: qty 1

## 2015-05-24 MED ORDER — MIDAZOLAM HCL 2 MG/2ML IJ SOLN
1.0000 mg | INTRAMUSCULAR | Status: DC | PRN
  Administered 2015-05-24: 2 mg via INTRAVENOUS

## 2015-05-24 MED ORDER — POVIDONE-IODINE 10 % EX OINT
TOPICAL_OINTMENT | CUTANEOUS | Status: AC
  Filled 2015-05-24: qty 1

## 2015-05-24 MED ORDER — MIDAZOLAM HCL 2 MG/2ML IJ SOLN
INTRAMUSCULAR | Status: AC
  Filled 2015-05-24: qty 2

## 2015-05-24 MED ORDER — BUPIVACAINE HCL (PF) 0.5 % IJ SOLN
INTRAMUSCULAR | Status: DC | PRN
  Administered 2015-05-24: 10 mL

## 2015-05-24 MED ORDER — HEMOSTATIC AGENTS (NO CHARGE) OPTIME
TOPICAL | Status: DC | PRN
  Administered 2015-05-24: 2 via TOPICAL

## 2015-05-24 MED ORDER — SODIUM CHLORIDE 0.9 % IJ SOLN
INTRAMUSCULAR | Status: AC
  Filled 2015-05-24: qty 10

## 2015-05-24 MED ORDER — ROCURONIUM BROMIDE 100 MG/10ML IV SOLN
INTRAVENOUS | Status: DC | PRN
  Administered 2015-05-24: 5 mg via INTRAVENOUS
  Administered 2015-05-24: 25 mg via INTRAVENOUS

## 2015-05-24 MED ORDER — CIPROFLOXACIN IN D5W 400 MG/200ML IV SOLN
400.0000 mg | INTRAVENOUS | Status: AC
  Administered 2015-05-24: 400 mg via INTRAVENOUS
  Filled 2015-05-24: qty 200

## 2015-05-24 MED ORDER — EPHEDRINE SULFATE 50 MG/ML IJ SOLN
INTRAMUSCULAR | Status: DC | PRN
  Administered 2015-05-24: 5 mg via INTRAVENOUS

## 2015-05-24 MED ORDER — PHENYLEPHRINE 40 MCG/ML (10ML) SYRINGE FOR IV PUSH (FOR BLOOD PRESSURE SUPPORT)
PREFILLED_SYRINGE | INTRAVENOUS | Status: AC
  Filled 2015-05-24: qty 10

## 2015-05-24 MED ORDER — CIPROFLOXACIN HCL 500 MG PO TABS: 500.0000 mg | ORAL_TABLET | Freq: Two times a day (BID) | ORAL | Status: DC

## 2015-05-24 MED ORDER — FENTANYL CITRATE (PF) 100 MCG/2ML IJ SOLN
25.0000 ug | INTRAMUSCULAR | Status: DC | PRN
  Administered 2015-05-24: 25 ug via INTRAVENOUS
  Administered 2015-05-24: 50 ug via INTRAVENOUS
  Administered 2015-05-24: 25 ug via INTRAVENOUS
  Administered 2015-05-24: 50 ug via INTRAVENOUS
  Filled 2015-05-24: qty 2

## 2015-05-24 MED ORDER — LIDOCAINE HCL (CARDIAC) 10 MG/ML IV SOLN
INTRAVENOUS | Status: DC | PRN
  Administered 2015-05-24 (×2): 25 mg via INTRAVENOUS

## 2015-05-24 MED ORDER — PHENYLEPHRINE HCL 10 MG/ML IJ SOLN
INTRAMUSCULAR | Status: DC | PRN
  Administered 2015-05-24: 40 ug via INTRAVENOUS

## 2015-05-24 MED ORDER — GLYCOPYRROLATE 0.2 MG/ML IJ SOLN
INTRAMUSCULAR | Status: DC | PRN
  Administered 2015-05-24: 0.6 mg via INTRAVENOUS

## 2015-05-24 MED ORDER — LABETALOL HCL 5 MG/ML IV SOLN: 10.0000 mg | INTRAVENOUS | Status: DC | PRN

## 2015-05-24 MED ORDER — GLYCOPYRROLATE 0.2 MG/ML IJ SOLN
INTRAMUSCULAR | Status: AC
  Filled 2015-05-24: qty 1

## 2015-05-24 MED ORDER — LACTATED RINGERS IV SOLN
INTRAVENOUS | Status: DC
  Administered 2015-05-24: 10:00:00 via INTRAVENOUS

## 2015-05-24 MED ORDER — PROPOFOL 10 MG/ML IV BOLUS
INTRAVENOUS | Status: DC | PRN
  Administered 2015-05-24: 100 mg via INTRAVENOUS

## 2015-05-24 MED ORDER — FENTANYL CITRATE (PF) 100 MCG/2ML IJ SOLN
INTRAMUSCULAR | Status: AC
  Filled 2015-05-24: qty 4

## 2015-05-24 MED ORDER — ONDANSETRON HCL 4 MG/2ML IJ SOLN
INTRAMUSCULAR | Status: AC
  Filled 2015-05-24: qty 2

## 2015-05-24 MED ORDER — POVIDONE-IODINE 10 % OINT PACKET
TOPICAL_OINTMENT | CUTANEOUS | Status: DC | PRN
  Administered 2015-05-24: 1 via TOPICAL

## 2015-05-24 MED ORDER — GLYCOPYRROLATE 0.2 MG/ML IJ SOLN
INTRAMUSCULAR | Status: AC
  Filled 2015-05-24: qty 3

## 2015-05-24 MED ORDER — LIDOCAINE HCL (PF) 1 % IJ SOLN
INTRAMUSCULAR | Status: AC
  Filled 2015-05-24: qty 10

## 2015-05-24 MED ORDER — CHLORHEXIDINE GLUCONATE 4 % EX LIQD: 1.0000 "application " | Freq: Once | CUTANEOUS | Status: DC

## 2015-05-24 MED ORDER — NEOSTIGMINE METHYLSULFATE 10 MG/10ML IV SOLN
INTRAVENOUS | Status: DC | PRN
  Administered 2015-05-24: 3 mg via INTRAVENOUS

## 2015-05-24 SURGICAL SUPPLY — 43 items
APPLIER CLIP LAPSCP 10X32 DD (CLIP) ×3 IMPLANT
BAG HAMPER (MISCELLANEOUS) ×3 IMPLANT
CHLORAPREP W/TINT 26ML (MISCELLANEOUS) ×3 IMPLANT
CLOTH BEACON ORANGE TIMEOUT ST (SAFETY) ×3 IMPLANT
COVER LIGHT HANDLE STERIS (MISCELLANEOUS) ×6 IMPLANT
CUTTER LINEAR ENDO 35 ART THIN (STAPLE) ×3 IMPLANT
DECANTER SPIKE VIAL GLASS SM (MISCELLANEOUS) ×3 IMPLANT
ELECT REM PT RETURN 9FT ADLT (ELECTROSURGICAL) ×3
ELECTRODE REM PT RTRN 9FT ADLT (ELECTROSURGICAL) ×1 IMPLANT
FILTER SMOKE EVAC LAPAROSHD (FILTER) ×3 IMPLANT
FORMALIN 10 PREFIL 120ML (MISCELLANEOUS) ×3 IMPLANT
GLOVE BIOGEL PI IND STRL 7.0 (GLOVE) ×3 IMPLANT
GLOVE BIOGEL PI INDICATOR 7.0 (GLOVE) ×6
GLOVE SURG SS PI 6.5 STRL IVOR (GLOVE) ×3 IMPLANT
GLOVE SURG SS PI 7.0 STRL IVOR (GLOVE) ×3 IMPLANT
GLOVE SURG SS PI 7.5 STRL IVOR (GLOVE) ×3 IMPLANT
GOWN STRL REUS W/ TWL XL LVL3 (GOWN DISPOSABLE) ×1 IMPLANT
GOWN STRL REUS W/TWL LRG LVL3 (GOWN DISPOSABLE) ×6 IMPLANT
GOWN STRL REUS W/TWL XL LVL3 (GOWN DISPOSABLE) ×2
HEMOSTAT SNOW SURGICEL 2X4 (HEMOSTASIS) ×6 IMPLANT
INST SET LAPROSCOPIC AP (KITS) ×3 IMPLANT
IV NS IRRIG 3000ML ARTHROMATIC (IV SOLUTION) ×3 IMPLANT
KIT ROOM TURNOVER APOR (KITS) ×3 IMPLANT
MANIFOLD NEPTUNE II (INSTRUMENTS) ×3 IMPLANT
NEEDLE INSUFFLATION 14GA 120MM (NEEDLE) ×3 IMPLANT
NS IRRIG 1000ML POUR BTL (IV SOLUTION) ×3 IMPLANT
PACK LAP CHOLE LZT030E (CUSTOM PROCEDURE TRAY) ×3 IMPLANT
PAD ARMBOARD 7.5X6 YLW CONV (MISCELLANEOUS) ×3 IMPLANT
POUCH SPECIMEN RETRIEVAL 10MM (ENDOMECHANICALS) ×3 IMPLANT
SET BASIN LINEN APH (SET/KITS/TRAYS/PACK) ×3 IMPLANT
SET TUBE IRRIG SUCTION NO TIP (IRRIGATION / IRRIGATOR) ×3 IMPLANT
SLEEVE ENDOPATH XCEL 5M (ENDOMECHANICALS) ×3 IMPLANT
SPONGE GAUZE 2X2 8PLY STER LF (GAUZE/BANDAGES/DRESSINGS) ×1
SPONGE GAUZE 2X2 8PLY STRL LF (GAUZE/BANDAGES/DRESSINGS) ×2 IMPLANT
STAPLER VISISTAT (STAPLE) ×3 IMPLANT
SUT VICRYL 0 UR6 27IN ABS (SUTURE) ×3 IMPLANT
TAPE CLOTH SURG 4X10 WHT LF (GAUZE/BANDAGES/DRESSINGS) ×3 IMPLANT
TROCAR ENDO BLADELESS 11MM (ENDOMECHANICALS) ×3 IMPLANT
TROCAR XCEL NON-BLD 5MMX100MML (ENDOMECHANICALS) ×3 IMPLANT
TROCAR XCEL UNIV SLVE 11M 100M (ENDOMECHANICALS) ×3 IMPLANT
TUBING INSUFFLATION (TUBING) ×3 IMPLANT
WARMER LAPAROSCOPE (MISCELLANEOUS) ×3 IMPLANT
YANKAUER SUCT 12FT TUBE ARGYLE (SUCTIONS) ×3 IMPLANT

## 2015-05-24 NOTE — Anesthesia Procedure Notes (Signed)
Procedure Name: Intubation Date/Time: 05/24/2015 11:05 AM Performed by: Vista Deck Pre-anesthesia Checklist: Patient identified, Patient being monitored, Timeout performed, Emergency Drugs available and Suction available Patient Re-evaluated:Patient Re-evaluated prior to inductionOxygen Delivery Method: Circle System Utilized Preoxygenation: Pre-oxygenation with 100% oxygen Intubation Type: IV induction Ventilation: Mask ventilation without difficulty Laryngoscope Size: Miller and 2 Grade View: Grade I Tube type: Oral Tube size: 7.0 mm Number of attempts: 1 Airway Equipment and Method: Stylet Placement Confirmation: ETT inserted through vocal cords under direct vision,  positive ETCO2 and breath sounds checked- equal and bilateral Secured at: 22 cm Tube secured with: Tape Dental Injury: Teeth and Oropharynx as per pre-operative assessment

## 2015-05-24 NOTE — Interval H&P Note (Signed)
History and Physical Interval Note:  05/24/2015 10:36 AM  [employer]  has presented today for surgery, with the diagnosis of cholelithiasis  The various methods of treatment have been discussed with the patient and family. After consideration of risks, benefits and other options for treatment, the patient has consented to  Procedure(s): LAPAROSCOPIC CHOLECYSTECTOMY (N/A) as a surgical intervention .  The patient's history has been reviewed, patient examined, no change in status, stable for surgery.  I have reviewed the patient's chart and labs.  Questions were answered to the patient's satisfaction.     Aviva Signs A

## 2015-05-24 NOTE — Transfer of Care (Signed)
Immediate Anesthesia Transfer of Care Note  Patient: Scott Owens  Procedure(s) Performed: Procedure(s): LAPAROSCOPIC CHOLECYSTECTOMY (N/A)  Patient Location: PACU  Anesthesia Type:General  Level of Consciousness: awake, oriented and patient cooperative  Airway & Oxygen Therapy: Patient Spontanous Breathing and Patient connected to face mask oxygen  Post-op Assessment: Report given to RN and Post -op Vital signs reviewed and stable  Post vital signs: Reviewed and stable  Last Vitals:  Filed Vitals:   05/24/15 1050  BP: 135/81  Temp:   Resp: 22    Complications: No apparent anesthesia complications

## 2015-05-24 NOTE — Anesthesia Preprocedure Evaluation (Signed)
Anesthesia Evaluation  Patient identified by MRN, date of birth, ID band Patient awake    Reviewed: Allergy & Precautions, NPO status , Patient's Chart, lab work & pertinent test results  Airway Mallampati: II  TM Distance: >3 FB     Dental  (+) Edentulous Upper, Dental Advisory Given   Pulmonary former smoker,    breath sounds clear to auscultation       Cardiovascular negative cardio ROS   Rhythm:Regular Rate:Normal     Neuro/Psych  Headaches, PSYCHIATRIC DISORDERS Anxiety    GI/Hepatic hiatal hernia,   Endo/Other    Renal/GU      Musculoskeletal   Abdominal   Peds  Hematology   Anesthesia Other Findings   Reproductive/Obstetrics                             Anesthesia Physical Anesthesia Plan  ASA: II  Anesthesia Plan: General   Post-op Pain Management:    Induction: Intravenous  Airway Management Planned: Oral ETT  Additional Equipment:   Intra-op Plan:   Post-operative Plan: Extubation in OR  Informed Consent: I have reviewed the patients History and Physical, chart, labs and discussed the procedure including the risks, benefits and alternatives for the proposed anesthesia with the patient or authorized representative who has indicated his/her understanding and acceptance.     Plan Discussed with:   Anesthesia Plan Comments:         Anesthesia Quick Evaluation

## 2015-05-24 NOTE — Op Note (Signed)
Patient:  Scott Owens  DOB:  05/30/1941  MRN:  [id number]   Preop Diagnosis:  Cholecystitis, cholelithiasis  Postop Diagnosis:  Same  Procedure:  Laparoscopic cholecystectomy  Surgeon:  Aviva Signs, M.D.  Anes:  Gen. endotracheal  Indications:  Patient is a 74 year old white male who presents with cholecystitis secondary to cholelithiasis. The risks and benefits of the procedure including bleeding, infection, hepatobiliary injury, the possibility of an open procedure were fully explained to the patient, who gave informed consent.  Procedure note:  The patient is placed the supine position. After induction of general endotracheal anesthesia, the abdomen was prepped and draped using the usual sterile technique with DuraPrep. Surgical site confirmation was performed.  A supraumbilical incision was made down to the fascia. A Veress needle was introduced into the abdominal cavity and confirmation of placement was done using the saline drop test. The abdomen was then insufflated to 16 mmHg pressure. An 11 mm trocar was introduced into the abdominal cavity under direct visualization without difficulty. The patient was placed in reverse Trendelenburg position and additional 11 mm trocar was placed the epigastric region and 5 mm trochars were placed were upper quadrant and right flank regions. Liver was inspected and noted within normal limits. The gallbladder was contracted and full of stones. It was retracted superiorly and a dynamic fashion in order to provide a critical view of the triangle of Calot. The cystic duct was first identified. Its junction to the infundibulum was fully identified. A vascular Endo GIA was placed across the cystic duct and fired. The cystic artery was likewise ligated and divided using clips. The gallbladder was freed away from the gallbladder fossa using Bovie electrocautery. The gallbladder was delivered through the epigastric trocar site using an Endo Catch bag. The  gallbladder fossa was inspected and no abnormal bleeding or bile leakage was noted. Surgicel is placed the gallbladder fossa. All fluid and air were then evacuated from the abdominal cavity prior to removal of the trochars.  All wounds were irrigated with normal saline. All wounds were injected with 0.5% Sensorcaine. The supraumbilical fascia as well as epigastric fascia reapproximated using 0 Vicryl interrupted sutures. All skin incisions were closed using staples. Betadine ointment and dry sterile dressings were applied.  All tape and needle counts were correct at the end of the procedure. Patient was extubated in the operating room and transferred to PACU in stable condition.  Complications:  None  EBL:  Minimal  Specimen:  Gallbladder

## 2015-05-24 NOTE — Anesthesia Postprocedure Evaluation (Signed)
  Anesthesia Post-op Note  Patient: Scott Owens  Procedure(s) Performed: Procedure(s): LAPAROSCOPIC CHOLECYSTECTOMY (N/A)  Patient Location: PACU  Anesthesia Type:General  Level of Consciousness: awake, alert , oriented and patient cooperative  Airway and Oxygen Therapy: Patient Spontanous Breathing  Post-op Pain: none  Post-op Assessment: Post-op Vital signs reviewed, Patient's Cardiovascular Status Stable, Respiratory Function Stable, Patent Airway, No signs of Nausea or vomiting and Pain level controlled              Post-op Vital Signs: Reviewed and stable  Last Vitals:  Filed Vitals:   05/24/15 1215  BP: 143/84  Pulse: 88  Temp:   Resp: 11    Complications: No apparent anesthesia complications

## 2015-05-24 NOTE — Discharge Instructions (Signed)
Take dressings off Sunday, 05/26/15  Laparoscopic Cholecystectomy, Care After Refer to this sheet in the next few weeks. These instructions provide you with information about caring for yourself after your procedure. Your health care provider may also give you more specific instructions. Your treatment has been planned according to current medical practices, but problems sometimes occur. Call your health care provider if you have any problems or questions after your procedure. WHAT TO EXPECT AFTER THE PROCEDURE After your procedure, it is common to have:  Pain at your incision sites. You will be given pain medicines to control your pain.  Mild nausea or vomiting. This should improve after the first 24 hours.  Bloating and possible shoulder pain from the gas that was used during the procedure. This will improve after the first 24 hours. HOME CARE INSTRUCTIONS Incision Care  Follow instructions from your health care provider about how to take care of your incisions. Make sure you:  Wash your hands with soap and water before you change your bandage (dressing). If soap and water are not available, use hand sanitizer.  Change your dressing as told by your health care provider.  Leave stitches (sutures), skin glue, or adhesive strips in place. These skin closures may need to be in place for 2 weeks or longer. If adhesive strip edges start to loosen and curl up, you may trim the loose edges. Do not remove adhesive strips completely unless your health care provider tells you to do that.  Do not take baths, swim, or use a hot tub until your health care provider approves. Ask your health care provider if you can take showers. You may only be allowed to take sponge baths for bathing. General Instructions  Take over-the-counter and prescription medicines only as told by your health care provider.  Do not drive or operate heavy machinery while taking prescription pain medicine.  Return to your  normal diet as told by your health care provider.  Do not lift anything that is heavier than 10 lb (4.5 kg).  Do not play contact sports for one week or until your health care provider approves. SEEK MEDICAL CARE IF:   You have redness, swelling, or pain at the site of your incision.  You have fluid, blood, or pus coming from your incision.  You notice a bad smell coming from your incision area.  Your surgical incisions break open.  You have a fever. SEEK IMMEDIATE MEDICAL CARE IF:  You develop a rash.  You have difficulty breathing.  You have chest pain.  You have increasing pain in your shoulders (shoulder strap areas).  You faint or have dizzy episodes while you are standing.  You have severe pain in your abdomen.  You have nausea or vomiting that lasts for more than one day.   This information is not intended to replace advice given to you by your health care provider. Make sure you discuss any questions you have with your health care provider.   Document Released: 06/29/2005 Document Revised: 03/20/2015 Document Reviewed: 02/08/2013 Elsevier Interactive Patient Education [employer].

## 2015-05-24 NOTE — Interval H&P Note (Signed)
History and Physical Interval Note:  05/24/2015 10:37 AM  [place]  has presented today for surgery, with the diagnosis of cholelithiasis  The various methods of treatment have been discussed with the patient and family. After consideration of risks, benefits and other options for treatment, the patient has consented to  Procedure(s): LAPAROSCOPIC CHOLECYSTECTOMY (N/A) as a surgical intervention .  The patient's history has been reviewed, patient examined, no change in status, stable for surgery.  I have reviewed the patient's chart and labs.  Questions were answered to the patient's satisfaction.     Aviva Signs A

## 2015-05-27 ENCOUNTER — Encounter (HOSPITAL_COMMUNITY): Payer: Self-pay | Admitting: General Surgery

## 2015-06-17 ENCOUNTER — Ambulatory Visit (INDEPENDENT_AMBULATORY_CARE_PROVIDER_SITE_OTHER): Payer: Medicare Other | Admitting: Gastroenterology

## 2015-06-17 ENCOUNTER — Encounter: Payer: Self-pay | Admitting: Gastroenterology

## 2015-06-17 VITALS — BP 148/79 | HR 76 | Temp 97.3°F | Ht 71.0 in | Wt 209.0 lb

## 2015-06-17 DIAGNOSIS — R1010 Upper abdominal pain, unspecified: Secondary | ICD-10-CM | POA: Diagnosis not present

## 2015-06-17 NOTE — Progress Notes (Signed)
Referring Provider: Monico Blitz, MD Primary Care Physician:  Monico Blitz, MD  Primary GI: Dr. Gala Romney   Chief Complaint  Patient presents with  . Follow-up    HPI:   Scott Owens is a 74 y.o. male presenting today with a history of abdominal pain, undergoing EGD with Schatzki's ring s/p dilation, subsequent ultrasound concerning for cholecystitis. Underwent elective cholecystectomy. Now here to discuss routine updated colonoscopy.   Feeling much better. Abdominal pain resolved. Taking Protonix BID but would like to take once a Scott. States his last colonoscopy was in 2010 by Dr. Anthony Sar. He denies any polyps. Does not want a colonoscopy right now. Wants to wait on checking an ifobt until he sees his doctor in April 2017.   Past Medical History  Diagnosis Date  . Migraines   . Facial spasm     left side.  . Diarrhea   . Anxiety   . History of hiatal hernia   . Chronic back pain     Past Surgical History  Procedure Laterality Date  . Tonsillectomy    . Right ear surgery      ruptured ear drum  . Colonoscopy      in 2010 by Dr. Anthony Sar per patient   . Esophagogastroduodenoscopy      remote past by Dr. Gala Romney   . Esophagogastroduodenoscopy N/A 04/29/2015    Dr. Gareth Morgan ring s/p dilation/hiatal hernia  . Cholecystectomy N/A 05/24/2015    Procedure: LAPAROSCOPIC CHOLECYSTECTOMY;  Surgeon: Aviva Signs, MD;  Location: [facility];  Service: General;  Laterality: N/A;    Current Outpatient Prescriptions  Medication Sig Dispense Refill  . acetaminophen (TYLENOL) 500 MG tablet Take 1,000 mg by mouth every 6 (six) hours as needed for headache.    . clonazePAM (KLONOPIN) 0.5 MG tablet Take 0.5 mg by mouth 2 (two) times daily as needed for anxiety (sleep).    . divalproex (DEPAKOTE) 500 MG DR tablet Take 500 mg by mouth daily.    . fluticasone (FLONASE) 50 MCG/ACT nasal spray Place 2 sprays into both nostrils daily as needed for allergies or rhinitis.    Marland Kitchen  HYDROcodone-acetaminophen (NORCO/VICODIN) 5-325 MG tablet Take 1 tablet by mouth every 6 (six) hours as needed for moderate pain. 50 tablet 0  . loperamide (IMODIUM) 2 MG capsule Take by mouth as needed for diarrhea or loose stools.    . OnabotulinumtoxinA (BOTOX IJ) Inject as directed every 4 (four) months. For migraines.    . pantoprazole (PROTONIX) 40 MG tablet Take 1 tablet (40 mg total) by mouth 2 (two) times daily before a meal. 60 tablet 3  . triamcinolone cream (KENALOG) 0.1 % Apply 1 application topically 2 (two) times daily.      No current facility-administered medications for this visit.    Allergies as of 06/17/2015 - Review Complete 06/17/2015  Allergen Reaction Noted  . Sulfa antibiotics Other (See Comments) 04/24/2015  . Latex Rash 04/24/2015    Family History  Problem Relation Age of Onset  . Colon cancer Neg Hx     Social History   Social History  . Marital Status: Married    Spouse Name: N/A  . Number of Children: N/A  . Years of Education: N/A   Social History Main Topics  . Smoking status: Former Smoker -- 1.00 packs/Scott for 20 years    Types: Cigarettes    Quit date: 05/20/1978  . Smokeless tobacco: None  . Alcohol Use: No  . Drug Use: No  .  Sexual Activity: No   Other Topics Concern  . None   Social History Narrative    Review of Systems: Negative unless mentioned in HPI   Physical Exam: BP 148/79 mmHg  Pulse 76  Temp(Src) 97.3 F (36.3 C)  Ht 5\' 11"  (1.803 m)  Wt 209 lb (94.802 kg)  BMI 29.16 kg/m2 General:   Alert and oriented. No distress noted. Pleasant and cooperative.  Head:  Normocephalic and atraumatic. Eyes:  Conjuctiva clear without scleral icterus. Abdomen:  +BS, soft, non-tender and non-distended. No rebound or guarding. No HSM or masses noted. Msk:  Symmetrical without gross deformities. Normal posture. Extremities:  Without edema. Neurologic:  Alert and  oriented x4;  grossly normal neurologically. Psych:  Alert and  cooperative. Normal mood and affect.

## 2015-06-17 NOTE — Patient Instructions (Signed)
You may continue Protonix once a day, 30 minutes before breakfast.   We will see you back as needed!! Your next colonoscopy will be in 2020 unless anything changes. I recommend yearly hemoccults with your primary care doctor.

## 2015-06-17 NOTE — Assessment & Plan Note (Signed)
74 year old male with recent new onset dyspepsia, undergoing EGD with dilation of Schatzki's ring and subsequent ultrasound of abdomen concerning for cholecystitis. Since undergoing laparoscopic cholecystectomy, his pain has resolved and he is doing quite well. Declining colonoscopy now. After further discussion, appears his last colonoscopy was NOT at [facility] but at [facility]. He denies any prior history of colon polyps. He is declining a screening ifobt. He would like to wait until he sees his PCP in April 2017. He has no concerning lower GI symptoms. Will defer hemoccults to PCP per patient request. Next colonoscopy in 2020 unless clinical status changes.

## 2015-06-17 NOTE — Progress Notes (Signed)
cc'ed to pcp °

## 2015-11-06 DIAGNOSIS — R5383 Other fatigue: Secondary | ICD-10-CM | POA: Diagnosis not present

## 2015-11-06 DIAGNOSIS — Z299 Encounter for prophylactic measures, unspecified: Secondary | ICD-10-CM | POA: Diagnosis not present

## 2015-11-06 DIAGNOSIS — Z7189 Other specified counseling: Secondary | ICD-10-CM | POA: Diagnosis not present

## 2015-11-06 DIAGNOSIS — Z125 Encounter for screening for malignant neoplasm of prostate: Secondary | ICD-10-CM | POA: Diagnosis not present

## 2015-11-06 DIAGNOSIS — J309 Allergic rhinitis, unspecified: Secondary | ICD-10-CM | POA: Diagnosis not present

## 2015-11-06 DIAGNOSIS — Z0000 Encounter for general adult medical examination without abnormal findings: Secondary | ICD-10-CM | POA: Diagnosis not present

## 2015-11-06 DIAGNOSIS — E7800 Pure hypercholesterolemia, unspecified: Secondary | ICD-10-CM | POA: Diagnosis not present

## 2015-11-06 DIAGNOSIS — F329 Major depressive disorder, single episode, unspecified: Secondary | ICD-10-CM | POA: Diagnosis not present

## 2015-11-06 DIAGNOSIS — Z1389 Encounter for screening for other disorder: Secondary | ICD-10-CM | POA: Diagnosis not present

## 2015-11-06 DIAGNOSIS — Z6828 Body mass index (BMI) 28.0-28.9, adult: Secondary | ICD-10-CM | POA: Diagnosis not present

## 2015-11-06 DIAGNOSIS — R51 Headache: Secondary | ICD-10-CM | POA: Diagnosis not present

## 2015-11-06 DIAGNOSIS — Z1211 Encounter for screening for malignant neoplasm of colon: Secondary | ICD-10-CM | POA: Diagnosis not present

## 2015-12-03 DIAGNOSIS — F329 Major depressive disorder, single episode, unspecified: Secondary | ICD-10-CM | POA: Diagnosis not present

## 2015-12-03 DIAGNOSIS — M159 Polyosteoarthritis, unspecified: Secondary | ICD-10-CM | POA: Diagnosis not present

## 2016-01-31 DIAGNOSIS — M159 Polyosteoarthritis, unspecified: Secondary | ICD-10-CM | POA: Diagnosis not present

## 2016-01-31 DIAGNOSIS — F329 Major depressive disorder, single episode, unspecified: Secondary | ICD-10-CM | POA: Diagnosis not present

## 2016-02-05 DIAGNOSIS — R972 Elevated prostate specific antigen [PSA]: Secondary | ICD-10-CM | POA: Diagnosis not present

## 2016-02-05 DIAGNOSIS — G513 Clonic hemifacial spasm: Secondary | ICD-10-CM | POA: Diagnosis not present

## 2016-02-05 DIAGNOSIS — Z87891 Personal history of nicotine dependence: Secondary | ICD-10-CM | POA: Diagnosis not present

## 2016-02-05 DIAGNOSIS — Z6829 Body mass index (BMI) 29.0-29.9, adult: Secondary | ICD-10-CM | POA: Diagnosis not present

## 2016-02-05 DIAGNOSIS — Z299 Encounter for prophylactic measures, unspecified: Secondary | ICD-10-CM | POA: Diagnosis not present

## 2016-02-05 DIAGNOSIS — R252 Cramp and spasm: Secondary | ICD-10-CM | POA: Diagnosis not present

## 2016-02-19 DIAGNOSIS — H2513 Age-related nuclear cataract, bilateral: Secondary | ICD-10-CM | POA: Diagnosis not present

## 2016-02-19 DIAGNOSIS — H538 Other visual disturbances: Secondary | ICD-10-CM | POA: Diagnosis not present

## 2016-02-24 DIAGNOSIS — F329 Major depressive disorder, single episode, unspecified: Secondary | ICD-10-CM | POA: Diagnosis not present

## 2016-02-24 DIAGNOSIS — M159 Polyosteoarthritis, unspecified: Secondary | ICD-10-CM | POA: Diagnosis not present

## 2016-03-05 DIAGNOSIS — G513 Clonic hemifacial spasm: Secondary | ICD-10-CM | POA: Diagnosis not present

## 2016-04-15 DIAGNOSIS — Z23 Encounter for immunization: Secondary | ICD-10-CM | POA: Diagnosis not present

## 2016-05-12 DIAGNOSIS — F329 Major depressive disorder, single episode, unspecified: Secondary | ICD-10-CM | POA: Diagnosis not present

## 2016-05-12 DIAGNOSIS — I10 Essential (primary) hypertension: Secondary | ICD-10-CM | POA: Diagnosis not present

## 2016-05-12 DIAGNOSIS — Z6830 Body mass index (BMI) 30.0-30.9, adult: Secondary | ICD-10-CM | POA: Diagnosis not present

## 2016-05-12 DIAGNOSIS — Z299 Encounter for prophylactic measures, unspecified: Secondary | ICD-10-CM | POA: Diagnosis not present

## 2016-05-12 DIAGNOSIS — E7800 Pure hypercholesterolemia, unspecified: Secondary | ICD-10-CM | POA: Diagnosis not present

## 2016-06-08 DIAGNOSIS — M159 Polyosteoarthritis, unspecified: Secondary | ICD-10-CM | POA: Diagnosis not present

## 2016-06-08 DIAGNOSIS — F329 Major depressive disorder, single episode, unspecified: Secondary | ICD-10-CM | POA: Diagnosis not present

## 2016-06-23 DIAGNOSIS — E7800 Pure hypercholesterolemia, unspecified: Secondary | ICD-10-CM | POA: Diagnosis not present

## 2016-06-23 DIAGNOSIS — Z6831 Body mass index (BMI) 31.0-31.9, adult: Secondary | ICD-10-CM | POA: Diagnosis not present

## 2016-06-23 DIAGNOSIS — I10 Essential (primary) hypertension: Secondary | ICD-10-CM | POA: Diagnosis not present

## 2016-06-23 DIAGNOSIS — Z299 Encounter for prophylactic measures, unspecified: Secondary | ICD-10-CM | POA: Diagnosis not present

## 2016-06-23 DIAGNOSIS — F329 Major depressive disorder, single episode, unspecified: Secondary | ICD-10-CM | POA: Diagnosis not present

## 2016-06-25 DIAGNOSIS — R253 Fasciculation: Secondary | ICD-10-CM | POA: Diagnosis not present

## 2016-06-25 DIAGNOSIS — G513 Clonic hemifacial spasm: Secondary | ICD-10-CM | POA: Diagnosis not present

## 2016-06-29 DIAGNOSIS — M159 Polyosteoarthritis, unspecified: Secondary | ICD-10-CM | POA: Diagnosis not present

## 2016-06-29 DIAGNOSIS — F329 Major depressive disorder, single episode, unspecified: Secondary | ICD-10-CM | POA: Diagnosis not present

## 2016-07-24 DIAGNOSIS — I10 Essential (primary) hypertension: Secondary | ICD-10-CM | POA: Diagnosis not present

## 2016-07-24 DIAGNOSIS — Z6831 Body mass index (BMI) 31.0-31.9, adult: Secondary | ICD-10-CM | POA: Diagnosis not present

## 2016-07-24 DIAGNOSIS — Z299 Encounter for prophylactic measures, unspecified: Secondary | ICD-10-CM | POA: Diagnosis not present

## 2016-07-24 DIAGNOSIS — Z713 Dietary counseling and surveillance: Secondary | ICD-10-CM | POA: Diagnosis not present

## 2016-10-22 DIAGNOSIS — G513 Clonic hemifacial spasm: Secondary | ICD-10-CM | POA: Diagnosis not present

## 2016-11-09 DIAGNOSIS — F329 Major depressive disorder, single episode, unspecified: Secondary | ICD-10-CM | POA: Diagnosis not present

## 2016-11-09 DIAGNOSIS — Z299 Encounter for prophylactic measures, unspecified: Secondary | ICD-10-CM | POA: Diagnosis not present

## 2016-11-09 DIAGNOSIS — Z6831 Body mass index (BMI) 31.0-31.9, adult: Secondary | ICD-10-CM | POA: Diagnosis not present

## 2016-11-09 DIAGNOSIS — H6120 Impacted cerumen, unspecified ear: Secondary | ICD-10-CM | POA: Diagnosis not present

## 2016-11-09 DIAGNOSIS — Z1389 Encounter for screening for other disorder: Secondary | ICD-10-CM | POA: Diagnosis not present

## 2016-11-09 DIAGNOSIS — Z7189 Other specified counseling: Secondary | ICD-10-CM | POA: Diagnosis not present

## 2016-11-09 DIAGNOSIS — Z0000 Encounter for general adult medical examination without abnormal findings: Secondary | ICD-10-CM | POA: Diagnosis not present

## 2016-11-09 DIAGNOSIS — Z1211 Encounter for screening for malignant neoplasm of colon: Secondary | ICD-10-CM | POA: Diagnosis not present

## 2016-11-09 LAB — IFOBT (OCCULT BLOOD): IFOBT: POSITIVE

## 2016-11-11 ENCOUNTER — Encounter: Payer: Self-pay | Admitting: Internal Medicine

## 2016-11-11 DIAGNOSIS — E7800 Pure hypercholesterolemia, unspecified: Secondary | ICD-10-CM | POA: Diagnosis not present

## 2016-11-11 DIAGNOSIS — Z79899 Other long term (current) drug therapy: Secondary | ICD-10-CM | POA: Diagnosis not present

## 2016-11-11 DIAGNOSIS — Z125 Encounter for screening for malignant neoplasm of prostate: Secondary | ICD-10-CM | POA: Diagnosis not present

## 2016-11-11 DIAGNOSIS — F411 Generalized anxiety disorder: Secondary | ICD-10-CM | POA: Diagnosis not present

## 2016-11-11 DIAGNOSIS — R5383 Other fatigue: Secondary | ICD-10-CM | POA: Diagnosis not present

## 2016-12-03 ENCOUNTER — Ambulatory Visit (INDEPENDENT_AMBULATORY_CARE_PROVIDER_SITE_OTHER): Payer: Medicare Other | Admitting: Gastroenterology

## 2016-12-03 ENCOUNTER — Other Ambulatory Visit: Payer: Self-pay

## 2016-12-03 ENCOUNTER — Encounter: Payer: Self-pay | Admitting: Gastroenterology

## 2016-12-03 DIAGNOSIS — R195 Other fecal abnormalities: Secondary | ICD-10-CM | POA: Diagnosis not present

## 2016-12-03 MED ORDER — PEG 3350-KCL-NA BICARB-NACL 420 G PO SOLR: 4000.0000 mL | ORAL | 0 refills | Status: DC

## 2016-12-03 NOTE — Progress Notes (Addendum)
Referring Provider: Monico Blitz, MD Primary Care Physician:  Monico Blitz, MD Primary GI: Dr. Gala Romney   Chief Complaint  Patient presents with  . Blood In Stools    heme+, last tcs approx 10 yrs ago at [facility] by Dr. Anthony Sar  . Abdominal Pain    lower abd "gets urge to go to bathroom" then goes away  . Diarrhea    sometimes loose    HPI:   Scott Owens is a 76 y.o. male presenting today with heme positive stool through PCP's office, last colonoscopy approximately 2010 by Dr. Anthony Sar per patient. Denies any prior history of polyps.   No overt GI bleeding. Sometimes has some gas. Stomach growls. Sometimes diarrhea, a few weeks apart with episodes. Only will be about one episode in the morning and then resolve. Unable to pinpoint any triggers. Most of the time has a productive BM. Has a small amount of discomfort in lower abdomen after drinking coffee, which will then result in a BM. If doesn't drink coffee, won't go. No N/V. Good appetite. No weight loss. No dysphagia.   Past Medical History:  Diagnosis Date  . Anxiety   . Chronic back pain   . Diarrhea   . Facial spasm    left side.  Marland Kitchen History of hiatal hernia   . Migraines     Past Surgical History:  Procedure Laterality Date  . CHOLECYSTECTOMY N/A 05/24/2015   Procedure: LAPAROSCOPIC CHOLECYSTECTOMY;  Surgeon: Aviva Signs, MD;  Location: [facility];  Service: General;  Laterality: N/A;  . COLONOSCOPY     in 2010 by Dr. Anthony Sar per patient   . ESOPHAGOGASTRODUODENOSCOPY     remote past by Dr. Gala Romney   . ESOPHAGOGASTRODUODENOSCOPY N/A 04/29/2015   Dr. Gareth Morgan ring s/p dilation/hiatal hernia  . right ear surgery     ruptured ear drum  . TONSILLECTOMY      Current Outpatient Prescriptions  Medication Sig Dispense Refill  . acetaminophen (TYLENOL) 500 MG tablet Take 1,000 mg by mouth every 6 (six) hours as needed for headache.    . clonazePAM (KLONOPIN) 0.5 MG tablet Take 0.5 mg by mouth 2 (two) times daily as  needed for anxiety (sleep).    . divalproex (DEPAKOTE) 500 MG DR tablet Take 500 mg by mouth daily.    . fluticasone (FLONASE) 50 MCG/ACT nasal spray Place 2 sprays into both nostrils daily as needed for allergies or rhinitis.    Marland Kitchen loperamide (IMODIUM) 2 MG capsule Take by mouth as needed for diarrhea or loose stools.    . metoprolol succinate (TOPROL-XL) 25 MG 24 hr tablet Take 25 mg by mouth daily.  1  . omeprazole (PRILOSEC OTC) 20 MG tablet Take 20 mg by mouth as needed.    . OnabotulinumtoxinA (BOTOX IJ) Inject as directed every 4 (four) months. For migraines.    . triamcinolone cream (KENALOG) 0.1 % Apply 1 application topically 2 (two) times daily.      No current facility-administered medications for this visit.     Allergies as of 12/03/2016 - Review Complete 12/03/2016  Allergen Reaction Noted  . Sulfa antibiotics Other (See Comments) 04/24/2015  . Latex Rash 04/24/2015    Family History  Problem Relation Age of Onset  . Colon cancer Neg Hx   . Colon polyps Neg Hx     Social History   Social History  . Marital status: Married    Spouse name: N/A  . Number of children: N/A  . Years  of education: N/A   Occupational History  . Retired     2002   Social History Main Topics  . Smoking status: Former Smoker    Packs/day: 1.00    Years: 20.00    Types: Cigarettes    Quit date: 05/20/1978  . Smokeless tobacco: Never Used  . Alcohol use No  . Drug use: No  . Sexual activity: No   Other Topics Concern  . None   Social History Narrative  . None    Review of Systems: Gen: Denies fever, chills, anorexia. Denies fatigue, weakness, weight loss.  CV: +dizziness, chronic  Resp: Denies dyspnea at rest, cough, wheezing, coughing up blood, and pleurisy. GI: see HPI  Derm: Denies rash, itching, dry skin Psych: Denies depression, anxiety, memory loss, confusion. No homicidal or suicidal ideation.  Heme: Denies bruising, bleeding, and enlarged lymph nodes.  Physical  Exam: BP (!) 147/75   Pulse 65   Temp 97.2 F (36.2 C) (Oral)   Ht 6' (1.829 m)   Wt 225 lb 6.4 oz (102.2 kg)   BMI 30.57 kg/m  General:   Alert and oriented. No distress noted. Pleasant and cooperative.  Head:  Normocephalic and atraumatic. Eyes:  Conjuctiva clear without scleral icterus. Mouth:  Oral mucosa pink and moist. Good dentition. No lesions. Heart:  S1, S2 present without murmurs Lungs: clear to auscultation bilaterally Abdomen:  +BS, soft, non-tender and non-distended. No rebound or guarding. No HSM or masses noted. Msk:  Symmetrical without gross deformities. Normal posture. Extremities:  Without edema. Neurologic:  Alert and  oriented x4;  grossly normal neurologically. Psych:  Alert and cooperative. Normal mood and affect.  Outside labs dated 5/3: BUN 10, Cr 0.84, LFTs normal, PSA elevated at 5 and PCP following, TSH normal at 3.270, Hgb 15.1, Hct 44.1, Platelets 236.

## 2016-12-03 NOTE — Assessment & Plan Note (Signed)
76 year old male with heme positive stool, no overt GI bleeding, no concerning lower or upper GI signs/symptoms. Last colonoscopy around 2010 per patient by Dr. Anthony Sar. He is without any history of polyps, FH of colon cancer, or FH of colon polyps.  Proceed with TCS with Dr. Gala Romney in near future: the risks, benefits, and alternatives have been discussed with the patient in detail. The patient states understanding and desires to proceed.

## 2016-12-03 NOTE — Progress Notes (Signed)
CC'ED TO PCP 

## 2016-12-03 NOTE — Patient Instructions (Signed)
We have scheduled you for a colonoscopy with Dr. Rourk in the near future.  Further recommendations to follow!   

## 2016-12-07 IMAGING — DX DG ABDOMEN ACUTE W/ 1V CHEST
4 series · 4 of 4 positions shown · non-contrast
Comparison: Chest x-ray and CT scan dated 04/20/2015 radiographs
dated 02/26/2015 and 06/29/2013

CLINICAL DATA: Upper abdominal pain.

EXAM:
DG ABDOMEN ACUTE W/ 1V CHEST

[chest pa]
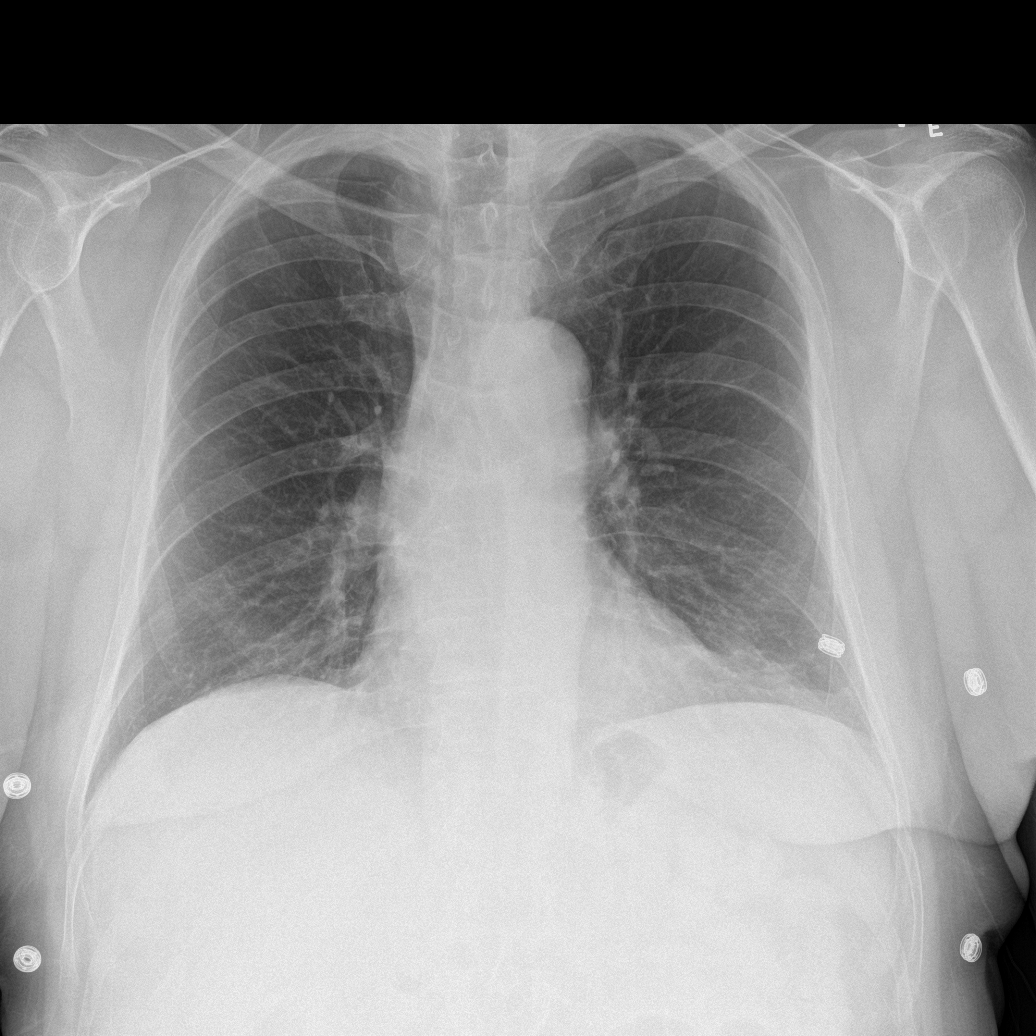

[abdomen erect]
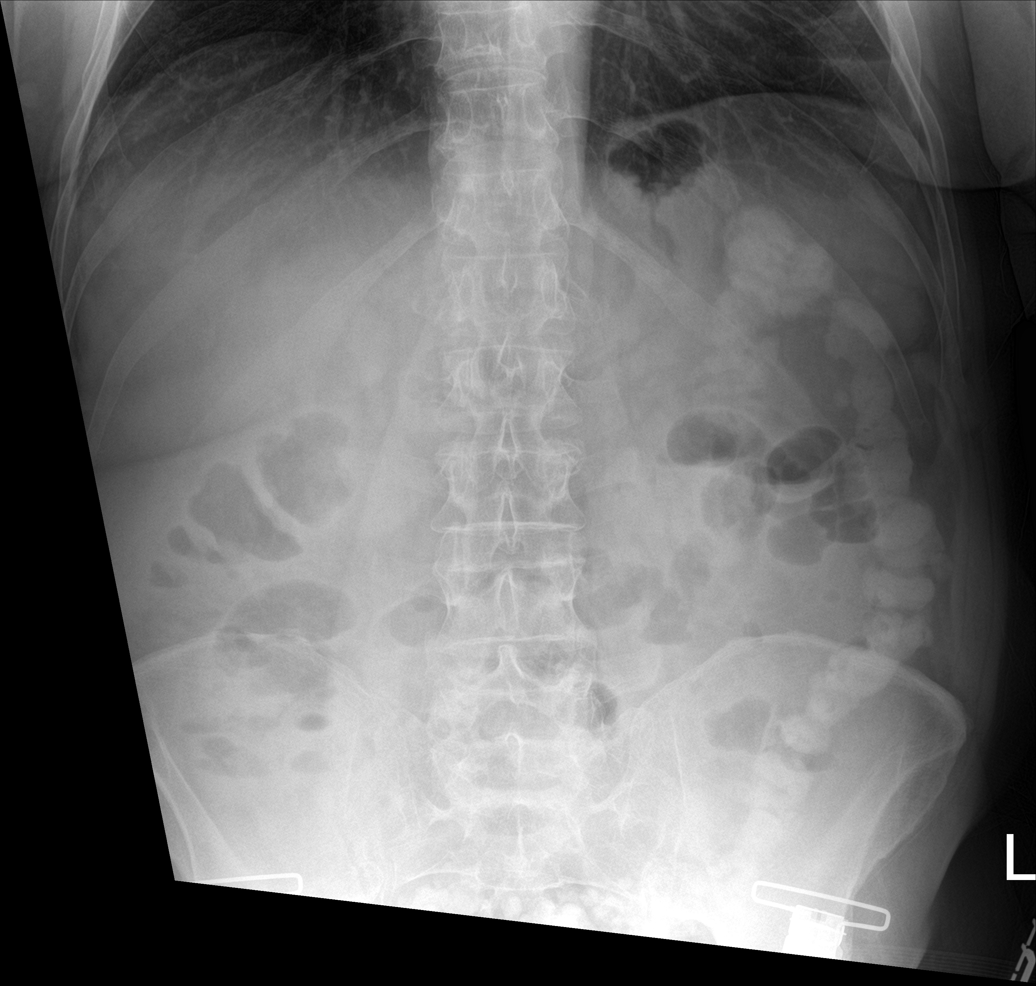

[abdomen supine (1 of 2)]
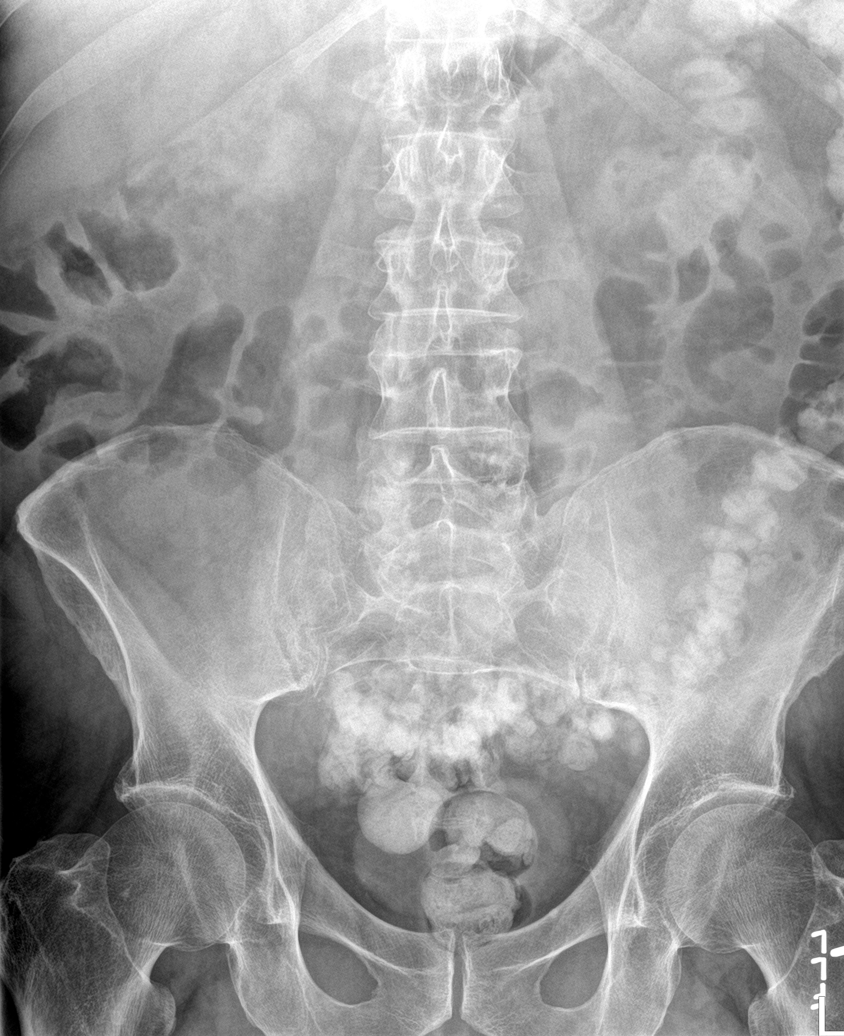

[abdomen supine (2 of 2)]
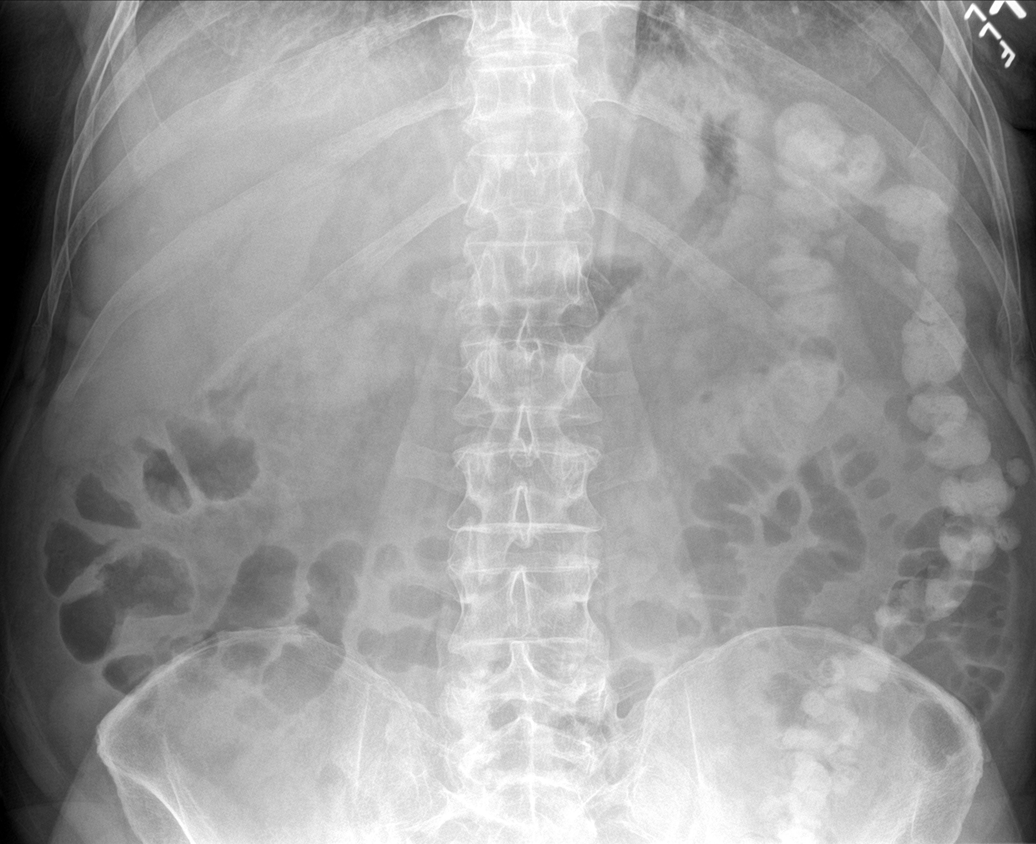

[4 of 4 positions shown; findings below may reference images not displayed]

FINDINGS: There are no dilated loops of large small bowel. There is contrast
in the distal colon secondary to the recent CT scan. Numerous
diverticula in the distal colon. No osseous abnormalities.

Heart size and vascularity are normal and the lungs are clear except
for minimal scarring in the lingula.
IMPRESSION: Negative abdominal radiographs.  No acute cardiopulmonary disease.

## 2017-01-20 ENCOUNTER — Ambulatory Visit (HOSPITAL_COMMUNITY)
Admission: RE | Admit: 2017-01-20 | Discharge: 2017-01-20 | Disposition: A | Discharge status: Home / Self Care | Payer: Medicare Other | Source: Ambulatory Visit | Attending: Internal Medicine | Admitting: Internal Medicine

## 2017-01-20 ENCOUNTER — Encounter (HOSPITAL_COMMUNITY): Payer: Self-pay | Admitting: *Deleted

## 2017-01-20 ENCOUNTER — Encounter (HOSPITAL_COMMUNITY)
Admission: RE | Disposition: A | Discharge status: Home / Self Care | Payer: Self-pay | Source: Ambulatory Visit | Attending: Internal Medicine

## 2017-01-20 DIAGNOSIS — R197 Diarrhea, unspecified: Secondary | ICD-10-CM | POA: Insufficient documentation

## 2017-01-20 DIAGNOSIS — K449 Diaphragmatic hernia without obstruction or gangrene: Secondary | ICD-10-CM | POA: Insufficient documentation

## 2017-01-20 DIAGNOSIS — Z882 Allergy status to sulfonamides status: Secondary | ICD-10-CM | POA: Diagnosis not present

## 2017-01-20 DIAGNOSIS — M549 Dorsalgia, unspecified: Secondary | ICD-10-CM | POA: Insufficient documentation

## 2017-01-20 DIAGNOSIS — K5900 Constipation, unspecified: Secondary | ICD-10-CM | POA: Insufficient documentation

## 2017-01-20 DIAGNOSIS — D122 Benign neoplasm of ascending colon: Secondary | ICD-10-CM | POA: Diagnosis not present

## 2017-01-20 DIAGNOSIS — K921 Melena: Secondary | ICD-10-CM | POA: Insufficient documentation

## 2017-01-20 DIAGNOSIS — G43909 Migraine, unspecified, not intractable, without status migrainosus: Secondary | ICD-10-CM | POA: Insufficient documentation

## 2017-01-20 DIAGNOSIS — D123 Benign neoplasm of transverse colon: Secondary | ICD-10-CM | POA: Diagnosis not present

## 2017-01-20 DIAGNOSIS — Z91040 Latex allergy status: Secondary | ICD-10-CM | POA: Diagnosis not present

## 2017-01-20 DIAGNOSIS — K219 Gastro-esophageal reflux disease without esophagitis: Secondary | ICD-10-CM | POA: Insufficient documentation

## 2017-01-20 DIAGNOSIS — G8929 Other chronic pain: Secondary | ICD-10-CM | POA: Diagnosis not present

## 2017-01-20 DIAGNOSIS — G513 Clonic hemifacial spasm: Secondary | ICD-10-CM | POA: Diagnosis not present

## 2017-01-20 DIAGNOSIS — F419 Anxiety disorder, unspecified: Secondary | ICD-10-CM | POA: Diagnosis not present

## 2017-01-20 DIAGNOSIS — Z79899 Other long term (current) drug therapy: Secondary | ICD-10-CM | POA: Diagnosis not present

## 2017-01-20 DIAGNOSIS — K5730 Diverticulosis of large intestine without perforation or abscess without bleeding: Secondary | ICD-10-CM

## 2017-01-20 DIAGNOSIS — R195 Other fecal abnormalities: Secondary | ICD-10-CM | POA: Diagnosis not present

## 2017-01-20 DIAGNOSIS — Z87891 Personal history of nicotine dependence: Secondary | ICD-10-CM | POA: Insufficient documentation

## 2017-01-20 HISTORY — PX: POLYPECTOMY: SHX5525

## 2017-01-20 HISTORY — PX: COLONOSCOPY: SHX5424

## 2017-01-20 HISTORY — DX: Gastro-esophageal reflux disease without esophagitis: K21.9

## 2017-01-20 SURGERY — COLONOSCOPY
Anesthesia: Moderate Sedation

## 2017-01-20 MED ORDER — STERILE WATER FOR IRRIGATION IR SOLN
Status: DC | PRN
  Administered 2017-01-20: 10:00:00

## 2017-01-20 MED ORDER — ONDANSETRON HCL 4 MG/2ML IJ SOLN
INTRAMUSCULAR | Status: AC
  Filled 2017-01-20: qty 2

## 2017-01-20 MED ORDER — MIDAZOLAM HCL 5 MG/5ML IJ SOLN
INTRAMUSCULAR | Status: AC
  Filled 2017-01-20: qty 10

## 2017-01-20 MED ORDER — MEPERIDINE HCL 100 MG/ML IJ SOLN
INTRAMUSCULAR | Status: AC
  Filled 2017-01-20: qty 2

## 2017-01-20 MED ORDER — ONDANSETRON HCL 4 MG/2ML IJ SOLN
INTRAMUSCULAR | Status: DC | PRN
  Administered 2017-01-20: 4 mg via INTRAVENOUS

## 2017-01-20 MED ORDER — MEPERIDINE HCL 100 MG/ML IJ SOLN
INTRAMUSCULAR | Status: DC | PRN
  Administered 2017-01-20: 25 mg via INTRAVENOUS
  Administered 2017-01-20: 50 mg via INTRAVENOUS
  Administered 2017-01-20: 25 mg via INTRAVENOUS

## 2017-01-20 MED ORDER — MIDAZOLAM HCL 5 MG/5ML IJ SOLN
INTRAMUSCULAR | Status: DC | PRN
  Administered 2017-01-20: 2 mg via INTRAVENOUS
  Administered 2017-01-20 (×2): 1 mg via INTRAVENOUS
  Administered 2017-01-20: 2 mg via INTRAVENOUS
  Administered 2017-01-20: 1 mg via INTRAVENOUS

## 2017-01-20 MED ORDER — SODIUM CHLORIDE 0.9 % IV SOLN
INTRAVENOUS | Status: DC
  Administered 2017-01-20: 09:00:00 via INTRAVENOUS

## 2017-01-20 NOTE — Op Note (Signed)
[facility] Patient Name: Scott Owens Procedure Date: 01/20/2017 9:05 AM MRN: [id number] Date of Birth: 11-Oct-1940 Attending MD: Norvel Richards , MD CSN: [id number] Age: 76 Admit Type: Outpatient Procedure:                Colonoscopy Indications:              Heme positive stool Providers:                Norvel Richards, MD, Janeece Riggers, RN, Aram Candela Referring MD:              Medicines:                Midazolam 7 mg IV, Meperidine 100 mg IV,                            Ondansetron 4 mg IV Complications:            No immediate complications. Estimated Blood Loss:     Estimated blood loss: none. Procedure:                Pre-Anesthesia Assessment:                           - Prior to the procedure, a History and Physical                            was performed, and patient medications and                            allergies were reviewed. The patient's tolerance of                            previous anesthesia was also reviewed. The risks                            and benefits of the procedure and the sedation                            options and risks were discussed with the patient.                            All questions were answered, and informed consent                            was obtained. Prior Anticoagulants: The patient has                            taken no previous anticoagulant or antiplatelet                            agents. ASA Grade Assessment: II - A patient with  mild systemic disease. After reviewing the risks                            and benefits, the patient was deemed in                            satisfactory condition to undergo the procedure.                           After obtaining informed consent, the colonoscope                            was passed under direct vision. Throughout the                            procedure, the patient's blood pressure, pulse, and                        oxygen saturations were monitored continuously. The                            EC-[id number] ([id number]) scope was introduced through                            the anus and advanced to the the cecum, identified                            by appendiceal orifice and ileocecal valve. The                            colonoscopy was performed without difficulty. The                            patient tolerated the procedure well. The quality                            of the bowel preparation was adequate. The                            ileocecal valve, appendiceal orifice, and rectum                            were photographed. The entire colon was well                            visualized. Scope In: 9:38:04 AM Scope Out: 10:04:55 AM Scope Withdrawal Time: 0 hours 15 minutes 24 seconds  Total Procedure Duration: 0 hours 26 minutes 51 seconds  Findings:      The perianal and digital rectal examinations were normal.      Many small and large-mouthed diverticula were found in the sigmoid colon       and descending colon.      A 12 mm polyp was found in the splenic flexure. The polyp was       semi-pedunculated. The polyp was removed with a  hot snare. Resection and       retrieval were complete. Estimated blood loss: none.      A 3 mm polyp was found in the ascending colon. The polyp was sessile.       The polyp was removed with a cold snare. Estimated blood loss was       minimal. polyp was not recovered.      The exam was otherwise without abnormality on direct and retroflexion       views. Impression:               - Diverticulosis in the sigmoid colon and in the                            descending colon.                           - One 12 mm polyp at the splenic flexure, removed                            with a hot snare. Resected and retrieved.                           - One 3 mm polyp in the ascending colon, removed                            with a cold snare.                            Polyp not recovered- The examination was otherwise                            normal on direct and retroflexion views. Moderate Sedation:      Moderate (conscious) sedation was administered by the endoscopy nurse       and supervised by the endoscopist. The following parameters were       monitored: oxygen saturation, heart rate, blood pressure, respiratory       rate, EKG, adequacy of pulmonary ventilation, and response to care.       Total physician intraservice time was 34 minutes. Recommendation:           - Patient has a contact number available for                            emergencies. The signs and symptoms of potential                            delayed complications were discussed with the                            patient. Return to normal activities tomorrow.                            Written discharge instructions were provided to the                            patient.                           -  Resume previous diet.                           - Continue present medications.                           - Await pathology results.                           - Repeat colonoscopy date to be determined after                            pending pathology results are reviewed for                            surveillance based on pathology results.                           - Return to GI clinic (date not yet determined). Procedure Code(s):        --- Professional ---                           [phone], Colonoscopy, flexible; with removal of                            tumor(s), polyp(s), or other lesion(s) by snare                            technique                           99152, Moderate sedation services provided by the                            same physician or other qualified health care                            professional performing the diagnostic or                            therapeutic service that the sedation supports,                             requiring the presence of an independent trained                            observer to assist in the monitoring of the                            patient's level of consciousness and physiological                            status; initial 15 minutes of intraservice time,                            patient  age 30 years or older                           [phone], Moderate sedation services; each additional                            15 minutes intraservice time Diagnosis Code(s):        --- Professional ---                           D12.3, Benign neoplasm of transverse colon (hepatic                            flexure or splenic flexure)                           D12.2, Benign neoplasm of ascending colon                           R19.5, Other fecal abnormalities                           K57.30, Diverticulosis of large intestine without                            perforation or abscess without bleeding CPT copyright 2016 [employer]. All rights reserved. The codes documented in this report are preliminary and upon coder review may  be revised to meet current compliance requirements. Cristopher Estimable. Jonika Critz, MD Norvel Richards, MD 01/20/2017 10:16:28 AM This report has been signed electronically. Number of Addenda: 0

## 2017-01-20 NOTE — H&P (Signed)
@LOGO @   Primary Care Physician:  Monico Blitz, MD Primary Gastroenterologist:  Dr. Gala Romney  Pre-Procedure History & Physical: HPI:  Scott Owens is a 76 y.o. male here for colonoscopy to evaluate Hemoccult-positive stool. Paper hematochezia only one episode ever.. Some alternating constipation and diarrhea.  Negative colonoscopy 8 years ago.  Past Medical History:  Diagnosis Date  . Anxiety   . Chronic back pain   . Diarrhea   . Facial spasm    left side.  Marland Kitchen GERD (gastroesophageal reflux disease)   . History of hiatal hernia   . Migraines     Past Surgical History:  Procedure Laterality Date  . CHOLECYSTECTOMY N/A 05/24/2015   Procedure: LAPAROSCOPIC CHOLECYSTECTOMY;  Surgeon: Aviva Signs, MD;  Location: [facility];  Service: General;  Laterality: N/A;  . COLONOSCOPY     in 2010 by Dr. Anthony Sar per patient   . ESOPHAGOGASTRODUODENOSCOPY     remote past by Dr. Gala Romney   . ESOPHAGOGASTRODUODENOSCOPY N/A 04/29/2015   Dr. Gareth Morgan ring s/p dilation/hiatal hernia  . right ear surgery     ruptured ear drum  . TONSILLECTOMY      Prior to Admission medications   Medication Sig Start Date End Date Taking? Authorizing Provider  acetaminophen (TYLENOL) 500 MG tablet Take 1,000 mg by mouth every 6 (six) hours as needed (for back/headaches.).    Yes [provider]  clonazePAM (KLONOPIN) 0.5 MG tablet Take 0.5 mg by mouth at bedtime.    Yes [provider]  divalproex (DEPAKOTE ER) 500 MG 24 hr tablet Take 500 mg by mouth daily before breakfast.  11/09/16  Yes [provider]  meclizine (ANTIVERT) 25 MG tablet Take 25 mg by mouth 3 (three) times daily as needed. For dizziness 11/09/16  Yes [provider]  metoprolol succinate (TOPROL-XL) 25 MG 24 hr tablet Take 25 mg by mouth daily at 2 PM.  10/01/16  Yes [provider]  omeprazole (PRILOSEC OTC) 20 MG tablet Take 20 mg by mouth daily as needed (for acid reflux/diificulty swallowing (2-3x's  a week)).    Yes [provider]  OnabotulinumtoxinA (BOTOX IJ) Inject as directed every 4 (four) months. For facial spasms/migraines.   Yes [provider]  polyethylene glycol-electrolytes (TRILYTE) 420 g solution Take 4,000 mLs by mouth as directed. 12/03/16  Yes Morrisa Aldaba, Cristopher Estimable, MD  triamcinolone cream (KENALOG) 0.1 % Apply 1 application topically 2 (two) times daily as needed (for ezcema).  03/08/15  Yes [provider]  fluticasone (FLONASE) 50 MCG/ACT nasal spray Place 2 sprays into both nostrils daily as needed for allergies or rhinitis.    [provider]  loperamide (IMODIUM) 2 MG capsule Take by mouth as needed for diarrhea or loose stools.    [provider]    Allergies as of 12/03/2016 - Review Complete 12/03/2016  Allergen Reaction Noted  . Sulfa antibiotics Other (See Comments) 04/24/2015  . Latex Rash 04/24/2015    Family History  Problem Relation Age of Onset  . Colon cancer Neg Hx   . Colon polyps Neg Hx     Social History   Social History  . Marital status: Married    Spouse name: N/A  . Number of children: N/A  . Years of education: N/A   Occupational History  . Retired     2002   Social History Main Topics  . Smoking status: Former Smoker    Packs/day: 1.00    Years: 20.00    Types:  Cigarettes    Quit date: 05/20/1978  . Smokeless tobacco: Never Used  . Alcohol use No  . Drug use: No  . Sexual activity: No   Other Topics Concern  . Not on file   Social History Narrative  . No narrative on file    Review of Systems: See HPI, otherwise negative ROS  Physical Exam: BP (!) 158/87   Pulse 73   Temp 98.5 F (36.9 C) (Oral)   Resp 13   Ht 6' (1.829 m)   Wt 225 lb (102.1 kg)   SpO2 99%   BMI 30.52 kg/m  General:   Alert,  Well-developed, well-nourished, pleasant and cooperative in NAD Skin:  Intact without significant lesions or rashes. Neck:  Supple; no masses or thyromegaly. No significant  cervical adenopathy. Lungs:  Clear throughout to auscultation.   No wheezes, crackles, or rhonchi. No acute distress. Heart:  Regular rate and rhythm; no murmurs, clicks, rubs,  or gallops. Abdomen: Non-distended, normal bowel sounds.  Soft and nontender without appreciable mass or hepatosplenomegaly.  Pulses:  Normal pulses noted. Extremities:  Without clubbing or edema.  Impression:  Pleasant 76 year old gentleman with Hemoccult-positive stool. One episode of a scant paper hematochezia in the distant past. Currently, no bowel symptoms. I agree, further evaluation is warranted via colonoscopy.  Recommendations: I have offered the a patient diagnostic colonoscopy today.  The risks, benefits, limitations, alternatives and imponderables have been reviewed with the patient. Questions have been answered. All parties are agreeable.      Notice: This dictation was prepared with Dragon dictation along with smaller phrase technology. Any transcriptional errors that result from this process are unintentional and may not be corrected upon review.

## 2017-01-20 NOTE — Discharge Instructions (Addendum)
Colonoscopy Discharge Instructions  Read the instructions outlined below and refer to this sheet in the next few weeks. These discharge instructions provide you with general information on caring for yourself after you leave the hospital. Your doctor may also give you specific instructions. While your treatment has been planned according to the most current medical practices available, unavoidable complications occasionally occur. If you have any problems or questions after discharge, call Dr. Gala Romney at [phone]. ACTIVITY  You may resume your regular activity, but move at a slower pace for the next 24 hours.   Take frequent rest periods for the next 24 hours.   Walking will help get rid of the air and reduce the bloated feeling in your belly (abdomen).   No driving for 24 hours (because of the medicine (anesthesia) used during the test).    Do not sign any important legal documents or operate any machinery for 24 hours (because of the anesthesia used during the test).  NUTRITION  Drink plenty of fluids.   You may resume your normal diet as instructed by your doctor.   Begin with a light meal and progress to your normal diet. Heavy or fried foods are harder to digest and may make you feel sick to your stomach (nauseated).   Avoid alcoholic beverages for 24 hours or as instructed.  MEDICATIONS  You may resume your normal medications unless your doctor tells you otherwise.  WHAT YOU CAN EXPECT TODAY  Some feelings of bloating in the abdomen.   Passage of more gas than usual.   Spotting of blood in your stool or on the toilet paper.  IF YOU HAD POLYPS REMOVED DURING THE COLONOSCOPY:  No aspirin products for 7 days or as instructed.   No alcohol for 7 days or as instructed.   Eat a soft diet for the next 24 hours.  FINDING OUT THE RESULTS OF YOUR TEST Not all test results are available during your visit. If your test results are not back during the visit, make an appointment  with your caregiver to find out the results. Do not assume everything is normal if you have not heard from your caregiver or the medical facility. It is important for you to follow up on all of your test results.  SEEK IMMEDIATE MEDICAL ATTENTION IF:  You have more than a spotting of blood in your stool.   Your belly is swollen (abdominal distention).   You are nauseated or vomiting.   You have a temperature over 101.   You have abdominal pain or discomfort that is severe or gets worse throughout the day.    Colon polyp and diverticulosis information provided  Begin Benefiber 1 tablespoon twice daily to regulate your bowel movements  Further recommendations to follow pending review of pathology report   Diverticulosis Diverticulosis is a condition that develops when small pouches (diverticula) form in the wall of the large intestine (colon). The colon is where water is absorbed and stool is formed. The pouches form when the inside layer of the colon pushes through weak spots in the outer layers of the colon. You may have a few pouches or many of them. What are the causes? The cause of this condition is not known. What increases the risk? The following factors may make you more likely to develop this condition:  Being older than age 29. Your risk for this condition increases with age. Diverticulosis is rare among people younger than age 71. By age 64, many people have it.  Eating a low-fiber diet.  Having frequent constipation.  Being overweight.  Not getting enough exercise.  Smoking.  Taking over-the-counter pain medicines, like aspirin and ibuprofen.  Having a family history of diverticulosis.  What are the signs or symptoms? In most people, there are no symptoms of this condition. If you do have symptoms, they may include:  Bloating.  Cramps in the abdomen.  Constipation or diarrhea.  Pain in the lower left side of the abdomen.  How is this diagnosed? This  condition is most often diagnosed during an exam for other colon problems. Because diverticulosis usually has no symptoms, it often cannot be diagnosed independently. This condition may be diagnosed by:  Using a flexible scope to examine the colon (colonoscopy).  Taking an X-ray of the colon after dye has been put into the colon (barium enema).  Doing a CT scan.  How is this treated? You may not need treatment for this condition if you have never developed an infection related to diverticulosis. If you have had an infection before, treatment may include:  Eating a high-fiber diet. This may include eating more fruits, vegetables, and grains.  Taking a fiber supplement.  Taking a live bacteria supplement (probiotic).  Taking medicine to relax your colon.  Taking antibiotic medicines.  Follow these instructions at home:  Drink 6-8 glasses of water or more each day to prevent constipation.  Try not to strain when you have a bowel movement.  If you have had an infection before: ? Eat more fiber as directed by your health care provider or your diet and nutrition specialist (dietitian). ? Take a fiber supplement or probiotic, if your health care provider approves.  Take over-the-counter and prescription medicines only as told by your health care provider.  If you were prescribed an antibiotic, take it as told by your health care provider. Do not stop taking the antibiotic even if you start to feel better.  Keep all follow-up visits as told by your health care provider. This is important. Contact a health care provider if:  You have pain in your abdomen.  You have bloating.  You have cramps.  You have not had a bowel movement in 3 days. Get help right away if:  Your pain gets worse.  Your bloating becomes very bad.  You have a fever or chills, and your symptoms suddenly get worse.  You vomit.  You have bowel movements that are bloody or black.  You have bleeding from  your rectum. Summary  Diverticulosis is a condition that develops when small pouches (diverticula) form in the wall of the large intestine (colon).  You may have a few pouches or many of them.  This condition is most often diagnosed during an exam for other colon problems.  If you have had an infection related to diverticulosis, treatment may include increasing the fiber in your diet, taking supplements, or taking medicines. This information is not intended to replace advice given to you by your health care provider. Make sure you discuss any questions you have with your health care provider. Document Released: 03/26/2004 Document Revised: 05/18/2016 Document Reviewed: 05/18/2016 Elsevier Interactive Patient Education  2017 [employer].  Colon Polyps Polyps are tissue growths inside the body. Polyps can grow in many places, including the large intestine (colon). A polyp may be a round bump or a mushroom-shaped growth. You could have one polyp or several. Most colon polyps are noncancerous (benign). However, some colon polyps can become cancerous over time. What are  the causes? The exact cause of colon polyps is not known. What increases the risk? This condition is more likely to develop in people who:  Have a family history of colon cancer or colon polyps.  Are older than 21 or older than 45 if they are African American.  Have inflammatory bowel disease, such as ulcerative colitis or Crohn disease.  Are overweight.  Smoke cigarettes.  Do not get enough exercise.  Drink too much alcohol.  Eat a diet that is: ? High in fat and red meat. ? Low in fiber.  Had childhood cancer that was treated with abdominal radiation.  What are the signs or symptoms? Most polyps do not cause symptoms. If you have symptoms, they may include:  Blood coming from your rectum when having a bowel movement.  Blood in your stool.The stool may look dark red or black.  A change in bowel habits,  such as constipation or diarrhea.  How is this diagnosed? This condition is diagnosed with a colonoscopy. This is a procedure that uses a lighted, flexible scope to look at the inside of your colon. How is this treated? Treatment for this condition involves removing any polyps that are found. Those polyps will then be tested for cancer. If cancer is found, your health care provider will talk to you about options for colon cancer treatment. Follow these instructions at home: Diet  Eat plenty of fiber, such as fruits, vegetables, and whole grains.  Eat foods that are high in calcium and vitamin D, such as milk, cheese, yogurt, eggs, liver, fish, and broccoli.  Limit foods high in fat, red meats, and processed meats, such as hot dogs, sausage, bacon, and lunch meats.  Maintain a healthy weight, or lose weight if recommended by your health care provider. General instructions  Do not smoke cigarettes.  Do not drink alcohol excessively.  Keep all follow-up visits as told by your health care provider. This is important. This includes keeping regularly scheduled colonoscopies. Talk to your health care provider about when you need a colonoscopy.  Exercise every day or as told by your health care provider. Contact a health care provider if:  You have new or worsening bleeding during a bowel movement.  You have new or increased blood in your stool.  You have a change in bowel habits.  You unexpectedly lose weight. This information is not intended to replace advice given to you by your health care provider. Make sure you discuss any questions you have with your health care provider. Document Released: 03/25/2004 Document Revised: 12/05/2015 Document Reviewed: 05/20/2015 Elsevier Interactive Patient Education  [employer].

## 2017-01-21 ENCOUNTER — Encounter: Payer: Self-pay | Admitting: Internal Medicine

## 2017-01-22 ENCOUNTER — Encounter (HOSPITAL_COMMUNITY): Payer: Self-pay | Admitting: Internal Medicine

## 2017-02-11 DIAGNOSIS — F411 Generalized anxiety disorder: Secondary | ICD-10-CM | POA: Diagnosis not present

## 2017-02-11 DIAGNOSIS — Z789 Other specified health status: Secondary | ICD-10-CM | POA: Diagnosis not present

## 2017-02-11 DIAGNOSIS — Z1389 Encounter for screening for other disorder: Secondary | ICD-10-CM | POA: Diagnosis not present

## 2017-02-11 DIAGNOSIS — Z299 Encounter for prophylactic measures, unspecified: Secondary | ICD-10-CM | POA: Diagnosis not present

## 2017-02-11 DIAGNOSIS — Z713 Dietary counseling and surveillance: Secondary | ICD-10-CM | POA: Diagnosis not present

## 2017-02-11 DIAGNOSIS — E7800 Pure hypercholesterolemia, unspecified: Secondary | ICD-10-CM | POA: Diagnosis not present

## 2017-02-11 DIAGNOSIS — I10 Essential (primary) hypertension: Secondary | ICD-10-CM | POA: Diagnosis not present

## 2017-02-11 DIAGNOSIS — Z6831 Body mass index (BMI) 31.0-31.9, adult: Secondary | ICD-10-CM | POA: Diagnosis not present

## 2017-02-11 DIAGNOSIS — N400 Enlarged prostate without lower urinary tract symptoms: Secondary | ICD-10-CM | POA: Diagnosis not present

## 2017-02-11 DIAGNOSIS — F329 Major depressive disorder, single episode, unspecified: Secondary | ICD-10-CM | POA: Diagnosis not present

## 2017-02-25 DIAGNOSIS — G513 Clonic hemifacial spasm: Secondary | ICD-10-CM | POA: Diagnosis not present

## 2017-03-02 DIAGNOSIS — Z299 Encounter for prophylactic measures, unspecified: Secondary | ICD-10-CM | POA: Diagnosis not present

## 2017-03-02 DIAGNOSIS — N400 Enlarged prostate without lower urinary tract symptoms: Secondary | ICD-10-CM | POA: Diagnosis not present

## 2017-03-02 DIAGNOSIS — Z6831 Body mass index (BMI) 31.0-31.9, adult: Secondary | ICD-10-CM | POA: Diagnosis not present

## 2017-03-02 DIAGNOSIS — I10 Essential (primary) hypertension: Secondary | ICD-10-CM | POA: Diagnosis not present

## 2017-03-02 DIAGNOSIS — M791 Myalgia: Secondary | ICD-10-CM | POA: Diagnosis not present

## 2017-03-05 DIAGNOSIS — Z6831 Body mass index (BMI) 31.0-31.9, adult: Secondary | ICD-10-CM | POA: Diagnosis not present

## 2017-03-05 DIAGNOSIS — Z713 Dietary counseling and surveillance: Secondary | ICD-10-CM | POA: Diagnosis not present

## 2017-03-05 DIAGNOSIS — Z299 Encounter for prophylactic measures, unspecified: Secondary | ICD-10-CM | POA: Diagnosis not present

## 2017-03-05 DIAGNOSIS — M25561 Pain in right knee: Secondary | ICD-10-CM | POA: Diagnosis not present

## 2017-03-05 DIAGNOSIS — G500 Trigeminal neuralgia: Secondary | ICD-10-CM | POA: Diagnosis not present

## 2017-03-11 DIAGNOSIS — Z713 Dietary counseling and surveillance: Secondary | ICD-10-CM | POA: Diagnosis not present

## 2017-03-11 DIAGNOSIS — K219 Gastro-esophageal reflux disease without esophagitis: Secondary | ICD-10-CM | POA: Diagnosis not present

## 2017-03-11 DIAGNOSIS — M549 Dorsalgia, unspecified: Secondary | ICD-10-CM | POA: Diagnosis not present

## 2017-03-11 DIAGNOSIS — M79604 Pain in right leg: Secondary | ICD-10-CM | POA: Diagnosis not present

## 2017-03-11 DIAGNOSIS — Q7649 Other congenital malformations of spine, not associated with scoliosis: Secondary | ICD-10-CM | POA: Diagnosis not present

## 2017-03-11 DIAGNOSIS — N400 Enlarged prostate without lower urinary tract symptoms: Secondary | ICD-10-CM | POA: Diagnosis not present

## 2017-03-11 DIAGNOSIS — E7800 Pure hypercholesterolemia, unspecified: Secondary | ICD-10-CM | POA: Diagnosis not present

## 2017-03-11 DIAGNOSIS — Z299 Encounter for prophylactic measures, unspecified: Secondary | ICD-10-CM | POA: Diagnosis not present

## 2017-03-11 DIAGNOSIS — I10 Essential (primary) hypertension: Secondary | ICD-10-CM | POA: Diagnosis not present

## 2017-03-11 DIAGNOSIS — F329 Major depressive disorder, single episode, unspecified: Secondary | ICD-10-CM | POA: Diagnosis not present

## 2017-03-11 DIAGNOSIS — Z6831 Body mass index (BMI) 31.0-31.9, adult: Secondary | ICD-10-CM | POA: Diagnosis not present

## 2017-03-11 DIAGNOSIS — M545 Low back pain: Secondary | ICD-10-CM | POA: Diagnosis not present

## 2017-03-17 DIAGNOSIS — Z299 Encounter for prophylactic measures, unspecified: Secondary | ICD-10-CM | POA: Diagnosis not present

## 2017-03-17 DIAGNOSIS — F329 Major depressive disorder, single episode, unspecified: Secondary | ICD-10-CM | POA: Diagnosis not present

## 2017-03-17 DIAGNOSIS — K5900 Constipation, unspecified: Secondary | ICD-10-CM | POA: Diagnosis not present

## 2017-03-17 DIAGNOSIS — Z6831 Body mass index (BMI) 31.0-31.9, adult: Secondary | ICD-10-CM | POA: Diagnosis not present

## 2017-03-17 DIAGNOSIS — E7800 Pure hypercholesterolemia, unspecified: Secondary | ICD-10-CM | POA: Diagnosis not present

## 2017-03-17 DIAGNOSIS — Z713 Dietary counseling and surveillance: Secondary | ICD-10-CM | POA: Diagnosis not present

## 2017-03-17 DIAGNOSIS — I739 Peripheral vascular disease, unspecified: Secondary | ICD-10-CM | POA: Diagnosis not present

## 2017-03-17 DIAGNOSIS — I10 Essential (primary) hypertension: Secondary | ICD-10-CM | POA: Diagnosis not present

## 2017-03-22 DIAGNOSIS — I70211 Atherosclerosis of native arteries of extremities with intermittent claudication, right leg: Secondary | ICD-10-CM | POA: Diagnosis not present

## 2017-03-31 DIAGNOSIS — Z299 Encounter for prophylactic measures, unspecified: Secondary | ICD-10-CM | POA: Diagnosis not present

## 2017-03-31 DIAGNOSIS — Z713 Dietary counseling and surveillance: Secondary | ICD-10-CM | POA: Diagnosis not present

## 2017-03-31 DIAGNOSIS — Z6831 Body mass index (BMI) 31.0-31.9, adult: Secondary | ICD-10-CM | POA: Diagnosis not present

## 2017-03-31 DIAGNOSIS — N400 Enlarged prostate without lower urinary tract symptoms: Secondary | ICD-10-CM | POA: Diagnosis not present

## 2017-03-31 DIAGNOSIS — F411 Generalized anxiety disorder: Secondary | ICD-10-CM | POA: Diagnosis not present

## 2017-03-31 DIAGNOSIS — F329 Major depressive disorder, single episode, unspecified: Secondary | ICD-10-CM | POA: Diagnosis not present

## 2017-03-31 DIAGNOSIS — E7800 Pure hypercholesterolemia, unspecified: Secondary | ICD-10-CM | POA: Diagnosis not present

## 2017-03-31 DIAGNOSIS — I10 Essential (primary) hypertension: Secondary | ICD-10-CM | POA: Diagnosis not present

## 2017-03-31 DIAGNOSIS — I739 Peripheral vascular disease, unspecified: Secondary | ICD-10-CM | POA: Diagnosis not present

## 2017-04-20 DIAGNOSIS — Z23 Encounter for immunization: Secondary | ICD-10-CM | POA: Diagnosis not present

## 2017-07-22 DIAGNOSIS — G5132 Clonic hemifacial spasm, left: Secondary | ICD-10-CM | POA: Diagnosis not present

## 2017-08-05 DIAGNOSIS — N419 Inflammatory disease of prostate, unspecified: Secondary | ICD-10-CM | POA: Diagnosis not present

## 2017-08-05 DIAGNOSIS — Z789 Other specified health status: Secondary | ICD-10-CM | POA: Diagnosis not present

## 2017-08-05 DIAGNOSIS — I10 Essential (primary) hypertension: Secondary | ICD-10-CM | POA: Diagnosis not present

## 2017-08-05 DIAGNOSIS — R350 Frequency of micturition: Secondary | ICD-10-CM | POA: Diagnosis not present

## 2017-08-05 DIAGNOSIS — Z6832 Body mass index (BMI) 32.0-32.9, adult: Secondary | ICD-10-CM | POA: Diagnosis not present

## 2017-08-05 DIAGNOSIS — Z299 Encounter for prophylactic measures, unspecified: Secondary | ICD-10-CM | POA: Diagnosis not present

## 2017-08-05 DIAGNOSIS — F411 Generalized anxiety disorder: Secondary | ICD-10-CM | POA: Diagnosis not present

## 2017-08-13 DIAGNOSIS — Z299 Encounter for prophylactic measures, unspecified: Secondary | ICD-10-CM | POA: Diagnosis not present

## 2017-08-13 DIAGNOSIS — N400 Enlarged prostate without lower urinary tract symptoms: Secondary | ICD-10-CM | POA: Diagnosis not present

## 2017-08-13 DIAGNOSIS — Z6832 Body mass index (BMI) 32.0-32.9, adult: Secondary | ICD-10-CM | POA: Diagnosis not present

## 2017-08-13 DIAGNOSIS — I10 Essential (primary) hypertension: Secondary | ICD-10-CM | POA: Diagnosis not present

## 2017-08-13 DIAGNOSIS — N410 Acute prostatitis: Secondary | ICD-10-CM | POA: Diagnosis not present

## 2017-09-08 DIAGNOSIS — I10 Essential (primary) hypertension: Secondary | ICD-10-CM | POA: Diagnosis not present

## 2017-09-08 DIAGNOSIS — F329 Major depressive disorder, single episode, unspecified: Secondary | ICD-10-CM | POA: Diagnosis not present

## 2017-09-08 DIAGNOSIS — Z299 Encounter for prophylactic measures, unspecified: Secondary | ICD-10-CM | POA: Diagnosis not present

## 2017-09-08 DIAGNOSIS — Z6832 Body mass index (BMI) 32.0-32.9, adult: Secondary | ICD-10-CM | POA: Diagnosis not present

## 2017-09-08 DIAGNOSIS — N419 Inflammatory disease of prostate, unspecified: Secondary | ICD-10-CM | POA: Diagnosis not present

## 2017-09-08 DIAGNOSIS — Z87891 Personal history of nicotine dependence: Secondary | ICD-10-CM | POA: Diagnosis not present

## 2017-09-08 DIAGNOSIS — K219 Gastro-esophageal reflux disease without esophagitis: Secondary | ICD-10-CM | POA: Diagnosis not present

## 2017-11-15 DIAGNOSIS — Z79899 Other long term (current) drug therapy: Secondary | ICD-10-CM | POA: Diagnosis not present

## 2017-11-15 DIAGNOSIS — N400 Enlarged prostate without lower urinary tract symptoms: Secondary | ICD-10-CM | POA: Diagnosis not present

## 2017-11-15 DIAGNOSIS — I10 Essential (primary) hypertension: Secondary | ICD-10-CM | POA: Diagnosis not present

## 2017-11-15 DIAGNOSIS — R5383 Other fatigue: Secondary | ICD-10-CM | POA: Diagnosis not present

## 2017-11-15 DIAGNOSIS — Z1339 Encounter for screening examination for other mental health and behavioral disorders: Secondary | ICD-10-CM | POA: Diagnosis not present

## 2017-11-15 DIAGNOSIS — Z125 Encounter for screening for malignant neoplasm of prostate: Secondary | ICD-10-CM | POA: Diagnosis not present

## 2017-11-15 DIAGNOSIS — E7800 Pure hypercholesterolemia, unspecified: Secondary | ICD-10-CM | POA: Diagnosis not present

## 2017-11-15 DIAGNOSIS — Z6832 Body mass index (BMI) 32.0-32.9, adult: Secondary | ICD-10-CM | POA: Diagnosis not present

## 2017-11-15 DIAGNOSIS — Z1211 Encounter for screening for malignant neoplasm of colon: Secondary | ICD-10-CM | POA: Diagnosis not present

## 2017-11-15 DIAGNOSIS — Z299 Encounter for prophylactic measures, unspecified: Secondary | ICD-10-CM | POA: Diagnosis not present

## 2017-11-15 DIAGNOSIS — Z1331 Encounter for screening for depression: Secondary | ICD-10-CM | POA: Diagnosis not present

## 2017-11-15 DIAGNOSIS — Z7189 Other specified counseling: Secondary | ICD-10-CM | POA: Diagnosis not present

## 2017-11-15 DIAGNOSIS — Z0000 Encounter for general adult medical examination without abnormal findings: Secondary | ICD-10-CM | POA: Diagnosis not present

## 2017-11-18 DIAGNOSIS — G5132 Clonic hemifacial spasm, left: Secondary | ICD-10-CM | POA: Diagnosis not present

## 2017-11-18 DIAGNOSIS — R253 Fasciculation: Secondary | ICD-10-CM | POA: Diagnosis not present

## 2018-01-24 DIAGNOSIS — Z713 Dietary counseling and surveillance: Secondary | ICD-10-CM | POA: Diagnosis not present

## 2018-01-24 DIAGNOSIS — Z299 Encounter for prophylactic measures, unspecified: Secondary | ICD-10-CM | POA: Diagnosis not present

## 2018-01-24 DIAGNOSIS — F329 Major depressive disorder, single episode, unspecified: Secondary | ICD-10-CM | POA: Diagnosis not present

## 2018-01-24 DIAGNOSIS — I10 Essential (primary) hypertension: Secondary | ICD-10-CM | POA: Diagnosis not present

## 2018-01-24 DIAGNOSIS — Z6832 Body mass index (BMI) 32.0-32.9, adult: Secondary | ICD-10-CM | POA: Diagnosis not present

## 2018-02-15 DIAGNOSIS — F329 Major depressive disorder, single episode, unspecified: Secondary | ICD-10-CM | POA: Diagnosis not present

## 2018-02-15 DIAGNOSIS — Z713 Dietary counseling and surveillance: Secondary | ICD-10-CM | POA: Diagnosis not present

## 2018-02-15 DIAGNOSIS — Z6832 Body mass index (BMI) 32.0-32.9, adult: Secondary | ICD-10-CM | POA: Diagnosis not present

## 2018-02-15 DIAGNOSIS — I10 Essential (primary) hypertension: Secondary | ICD-10-CM | POA: Diagnosis not present

## 2018-02-15 DIAGNOSIS — Z299 Encounter for prophylactic measures, unspecified: Secondary | ICD-10-CM | POA: Diagnosis not present

## 2018-03-24 DIAGNOSIS — G5132 Clonic hemifacial spasm, left: Secondary | ICD-10-CM | POA: Diagnosis not present

## 2018-03-24 DIAGNOSIS — G245 Blepharospasm: Secondary | ICD-10-CM | POA: Diagnosis not present

## 2018-04-21 DIAGNOSIS — Z23 Encounter for immunization: Secondary | ICD-10-CM | POA: Diagnosis not present

## 2018-05-20 DIAGNOSIS — Z87891 Personal history of nicotine dependence: Secondary | ICD-10-CM | POA: Diagnosis not present

## 2018-05-20 DIAGNOSIS — I10 Essential (primary) hypertension: Secondary | ICD-10-CM | POA: Diagnosis not present

## 2018-05-20 DIAGNOSIS — J029 Acute pharyngitis, unspecified: Secondary | ICD-10-CM | POA: Diagnosis not present

## 2018-05-20 DIAGNOSIS — Z299 Encounter for prophylactic measures, unspecified: Secondary | ICD-10-CM | POA: Diagnosis not present

## 2018-05-20 DIAGNOSIS — Z6832 Body mass index (BMI) 32.0-32.9, adult: Secondary | ICD-10-CM | POA: Diagnosis not present

## 2018-07-26 DIAGNOSIS — G245 Blepharospasm: Secondary | ICD-10-CM | POA: Diagnosis not present

## 2018-07-26 DIAGNOSIS — G5132 Clonic hemifacial spasm, left: Secondary | ICD-10-CM | POA: Diagnosis not present

## 2018-08-19 DIAGNOSIS — F411 Generalized anxiety disorder: Secondary | ICD-10-CM | POA: Diagnosis not present

## 2018-08-19 DIAGNOSIS — Z299 Encounter for prophylactic measures, unspecified: Secondary | ICD-10-CM | POA: Diagnosis not present

## 2018-08-19 DIAGNOSIS — I10 Essential (primary) hypertension: Secondary | ICD-10-CM | POA: Diagnosis not present

## 2018-08-19 DIAGNOSIS — Z6832 Body mass index (BMI) 32.0-32.9, adult: Secondary | ICD-10-CM | POA: Diagnosis not present

## 2018-08-19 DIAGNOSIS — Z87891 Personal history of nicotine dependence: Secondary | ICD-10-CM | POA: Diagnosis not present

## 2018-09-09 DIAGNOSIS — I10 Essential (primary) hypertension: Secondary | ICD-10-CM | POA: Diagnosis not present

## 2018-09-09 DIAGNOSIS — Z6833 Body mass index (BMI) 33.0-33.9, adult: Secondary | ICD-10-CM | POA: Diagnosis not present

## 2018-09-09 DIAGNOSIS — Z299 Encounter for prophylactic measures, unspecified: Secondary | ICD-10-CM | POA: Diagnosis not present

## 2018-09-09 DIAGNOSIS — Z87891 Personal history of nicotine dependence: Secondary | ICD-10-CM | POA: Diagnosis not present

## 2018-11-18 DIAGNOSIS — Z1331 Encounter for screening for depression: Secondary | ICD-10-CM | POA: Diagnosis not present

## 2018-11-18 DIAGNOSIS — Z6832 Body mass index (BMI) 32.0-32.9, adult: Secondary | ICD-10-CM | POA: Diagnosis not present

## 2018-11-18 DIAGNOSIS — F411 Generalized anxiety disorder: Secondary | ICD-10-CM | POA: Diagnosis not present

## 2018-11-18 DIAGNOSIS — Z1211 Encounter for screening for malignant neoplasm of colon: Secondary | ICD-10-CM | POA: Diagnosis not present

## 2018-11-18 DIAGNOSIS — Z125 Encounter for screening for malignant neoplasm of prostate: Secondary | ICD-10-CM | POA: Diagnosis not present

## 2018-11-18 DIAGNOSIS — Z79899 Other long term (current) drug therapy: Secondary | ICD-10-CM | POA: Diagnosis not present

## 2018-11-18 DIAGNOSIS — Z0000 Encounter for general adult medical examination without abnormal findings: Secondary | ICD-10-CM | POA: Diagnosis not present

## 2018-11-18 DIAGNOSIS — Z299 Encounter for prophylactic measures, unspecified: Secondary | ICD-10-CM | POA: Diagnosis not present

## 2018-11-18 DIAGNOSIS — F329 Major depressive disorder, single episode, unspecified: Secondary | ICD-10-CM | POA: Diagnosis not present

## 2018-11-18 DIAGNOSIS — E7800 Pure hypercholesterolemia, unspecified: Secondary | ICD-10-CM | POA: Diagnosis not present

## 2018-11-18 DIAGNOSIS — Z1339 Encounter for screening examination for other mental health and behavioral disorders: Secondary | ICD-10-CM | POA: Diagnosis not present

## 2018-11-18 DIAGNOSIS — R5383 Other fatigue: Secondary | ICD-10-CM | POA: Diagnosis not present

## 2018-11-18 DIAGNOSIS — Z7189 Other specified counseling: Secondary | ICD-10-CM | POA: Diagnosis not present

## 2018-12-28 DIAGNOSIS — N419 Inflammatory disease of prostate, unspecified: Secondary | ICD-10-CM | POA: Diagnosis not present

## 2018-12-28 DIAGNOSIS — I10 Essential (primary) hypertension: Secondary | ICD-10-CM | POA: Diagnosis not present

## 2018-12-28 DIAGNOSIS — Z299 Encounter for prophylactic measures, unspecified: Secondary | ICD-10-CM | POA: Diagnosis not present

## 2018-12-28 DIAGNOSIS — R109 Unspecified abdominal pain: Secondary | ICD-10-CM | POA: Diagnosis not present

## 2018-12-28 DIAGNOSIS — Z6832 Body mass index (BMI) 32.0-32.9, adult: Secondary | ICD-10-CM | POA: Diagnosis not present

## 2019-02-01 DIAGNOSIS — F411 Generalized anxiety disorder: Secondary | ICD-10-CM | POA: Diagnosis not present

## 2019-02-01 DIAGNOSIS — R1310 Dysphagia, unspecified: Secondary | ICD-10-CM | POA: Diagnosis not present

## 2019-02-01 DIAGNOSIS — I10 Essential (primary) hypertension: Secondary | ICD-10-CM | POA: Diagnosis not present

## 2019-02-01 DIAGNOSIS — Z299 Encounter for prophylactic measures, unspecified: Secondary | ICD-10-CM | POA: Diagnosis not present

## 2019-02-01 DIAGNOSIS — Z6832 Body mass index (BMI) 32.0-32.9, adult: Secondary | ICD-10-CM | POA: Diagnosis not present

## 2019-02-07 ENCOUNTER — Encounter: Payer: Self-pay | Admitting: Internal Medicine

## 2019-02-07 DIAGNOSIS — R1013 Epigastric pain: Secondary | ICD-10-CM | POA: Diagnosis not present

## 2019-02-07 DIAGNOSIS — R109 Unspecified abdominal pain: Secondary | ICD-10-CM | POA: Diagnosis not present

## 2019-03-15 DIAGNOSIS — R1013 Epigastric pain: Secondary | ICD-10-CM | POA: Insufficient documentation

## 2019-03-15 NOTE — Progress Notes (Signed)
Referring Provider: Monico Blitz, MD Primary Care Physician:  Monico Blitz, MD Primary GI Physician: Dr. Gala Romney  Chief Complaint  Patient presents with  . Abdominal Pain    started few months ago    HPI:   Scott Owens is a 78 y.o. male presenting today at the request of Dr. Manuella Ghazi for epigastric pain.   Patient had history of upper abdominal pain in 2016.  Evaluation with EGD in October 2016.  Findings include Schatzki's ring status post dilation, hiatal hernia,  normal gastric mucosa, normal first and second portion of the duodenum. Follow-up ultrasound in October 2016 with gallstones and possible cholecystitis.  Status post cholecystectomy in November 2016.  Upper abdominal pain resolved thereafter.   Last seen in office on 12/03/2016 due to heme positive stool.  Colonoscopy on 01/20/2017 with diverticulosis in the sigmoid and descending colon, one 12 mm polyp at splenic flexure resected and retrieved, one 3 mm polyp in the ascending colon removed but not recovered.  Exam is otherwise normal.  Pathology with tubular adenoma.  Recommended patient have one more colonoscopy in 3 years (2021).  Today he states he has had upper abdominal pain at the xiphoid process. Started after getting out of recliner and felt a sharp pain across lower chest. This was about 2 months ago. Feels like a soreness. Doesn't notice it unless he presses on the Xiphoid/lower sternum or if he lifts something or turns a certain way. Present every day at some point depending on what he does. Last few days it has not bothered him as much.  States it is improving, just slowly. Severity 3-4/10. Pain is not worsened with meals. Primary care prescribed Protonix to take daily. Had been taking omeprazole for years.  Was not having any trouble with breakthrough heartburn or acid reflux symptoms.  No dysphagia.  No nausea or vomiting.  Has gained about 25 pounds in the last 3 years.  Not active.  No NSAIDs.  Uses Tylenol.  Reports  he had an [place] at [facility] about 2 weeks ago.    BMs about every day. No constipation. Occasional diarrhea. No brbpr or melena. No lower abdominal pain.    Denies fever, chills. Admits to years of intermittent dizziness. This is unchanged. No symptoms of pre-syncope. Denies chest pain or heart palpitations.   Past Medical History:  Diagnosis Date  . Anxiety   . Chronic back pain   . Diarrhea   . Facial spasm    left side.  Marland Kitchen GERD (gastroesophageal reflux disease)   . History of hiatal hernia   . HTN (hypertension)   . Migraines     Past Surgical History:  Procedure Laterality Date  . CHOLECYSTECTOMY N/A 05/24/2015   Procedure: LAPAROSCOPIC CHOLECYSTECTOMY;  Surgeon: Aviva Signs, MD;  Location: [facility];  Service: General;  Laterality: N/A;  . COLONOSCOPY     in 2010 by Dr. Anthony Sar per patient   . COLONOSCOPY N/A 01/20/2017   Procedure: COLONOSCOPY;  Surgeon: Daneil Dolin, MD;  Location: [facility];  Service: Endoscopy;  Laterality: N/A;  945   . ESOPHAGOGASTRODUODENOSCOPY     remote past by Dr. Gala Romney   . ESOPHAGOGASTRODUODENOSCOPY N/A 04/29/2015   Dr. Gareth Morgan ring s/p dilation/hiatal hernia  . POLYPECTOMY  01/20/2017   Procedure: POLYPECTOMY;  Surgeon: Daneil Dolin, MD;  Location: [facility];  Service: Endoscopy;;  Ascending colon polyp cs, splenic flexure polyp hs  . right ear surgery     ruptured ear  drum  . TONSILLECTOMY      Current Outpatient Medications  Medication Sig Dispense Refill  . acetaminophen (TYLENOL) 500 MG tablet Take 1,000 mg by mouth every 6 (six) hours as needed (for back/headaches.).     Marland Kitchen clonazePAM (KLONOPIN) 0.5 MG tablet Take 0.5 mg by mouth at bedtime.     . divalproex (DEPAKOTE ER) 500 MG 24 hr tablet Take 500 mg by mouth daily before breakfast.   2  . fluticasone (FLONASE) 50 MCG/ACT nasal spray Place 2 sprays into both nostrils daily as needed for allergies or rhinitis.    Marland Kitchen loperamide (IMODIUM) 2 MG capsule Take by mouth as  needed for diarrhea or loose stools.    . meclizine (ANTIVERT) 25 MG tablet Take 25 mg by mouth 3 (three) times daily as needed. For dizziness  4  . metoprolol succinate (TOPROL-XL) 50 MG 24 hr tablet Take 50 mg by mouth daily at 2 PM.   1  . pantoprazole (PROTONIX) 40 MG tablet Take 40 mg by mouth daily.    . tamsulosin (FLOMAX) 0.4 MG CAPS capsule Take 0.4 mg by mouth daily.    Marland Kitchen triamcinolone cream (KENALOG) 0.1 % Apply 1 application topically 2 (two) times daily as needed (for ezcema).     . OnabotulinumtoxinA (BOTOX IJ) Inject as directed every 4 (four) months. For facial spasms/migraines.     No current facility-administered medications for this visit.     Allergies as of 03/16/2019 - Review Complete 03/16/2019  Allergen Reaction Noted  . Bupropion    . Hydrocodone-acetaminophen    . Oxycodone    . Sulfa antibiotics Other (See Comments) 04/24/2015  . Temazepam    . Zolpidem  05/12/2016  . Latex Rash 04/24/2015    Family History  Problem Relation Age of Onset  . Colon cancer Neg Hx   . Colon polyps Neg Hx     Social History   Socioeconomic History  . Marital status: Married    Spouse name: Not on file  . Number of children: Not on file  . Years of education: Not on file  . Highest education level: Not on file  Occupational History  . Occupation: Retired    Comment: 2002  Social Needs  . Financial resource strain: Not on file  . Food insecurity    Worry: Not on file    Inability: Not on file  . Transportation needs    Medical: Not on file    Non-medical: Not on file  Tobacco Use  . Smoking status: Former Smoker    Packs/day: 1.00    Years: 20.00    Pack years: 20.00    Types: Cigarettes    Quit date: 05/20/1978    Years since quitting: 40.8  . Smokeless tobacco: Never Used  Substance and Sexual Activity  . Alcohol use: No    Alcohol/week: 0.0 standard drinks  . Drug use: No  . Sexual activity: Never    Birth control/protection: None  Lifestyle  .  Physical activity    Days per week: Not on file    Minutes per session: Not on file  . Stress: Not on file  Relationships  . Social Herbalist on phone: Not on file    Gets together: Not on file    Attends religious service: Not on file    Active member of club or organization: Not on file    Attends meetings of clubs or organizations: Not on file  Relationship status: Not on file  Other Topics Concern  . Not on file  Social History Narrative  . Not on file    Review of Systems: Gen: See HPI CV: See HPI Resp: Denies dyspnea at rest. Occasional cough. Has been going on for years. Some SOB with exertion.  GI: See HPI MSK: Admits to upper back pain, chronic.  Derm: Denies rash, itching, dry skin Psych: Denies depression, anxiety Heme: Denies bruising, bleeding  Physical Exam: BP (!) 146/76   Pulse 71   Temp (!) 97.2 F (36.2 C) (Oral)   Ht 6' (1.829 m)   Wt 237 lb 12.8 oz (107.9 kg)   BMI 32.25 kg/m  General:   Alert and oriented. No distress noted. Pleasant and cooperative.  Head:  Normocephalic and atraumatic. Eyes:  Conjuctiva clear without scleral icterus. Left eye twitching. He receives Botox injections for this.  Heart:  S1, S2 present without murmurs appreciated. Lungs:  Clear to auscultation bilaterally. No wheezes, rales, or rhonchi. No distress.  Abdomen:  +BS, soft, non-tender and non-distended. No rebound or guarding. No HSM or masses noted. Msk:  Tenderness to palpation of xiphoid process. No tenderness to palpation of the rest of the sternum or ribs. Symmetrical without gross deformities. Normal posture.  Extremities:  Without edema. Neurologic:  Alert and  oriented x4 Psych:  Alert and cooperative. Normal mood and affect.

## 2019-03-16 ENCOUNTER — Ambulatory Visit (HOSPITAL_COMMUNITY)
Admission: RE | Admit: 2019-03-16 | Discharge: 2019-03-16 | Disposition: A | Discharge status: Home / Self Care | Payer: Medicare Other | Source: Ambulatory Visit | Attending: Gastroenterology | Admitting: Gastroenterology

## 2019-03-16 ENCOUNTER — Ambulatory Visit (INDEPENDENT_AMBULATORY_CARE_PROVIDER_SITE_OTHER): Payer: Medicare Other | Admitting: Gastroenterology

## 2019-03-16 ENCOUNTER — Encounter: Payer: Self-pay | Admitting: Gastroenterology

## 2019-03-16 ENCOUNTER — Other Ambulatory Visit: Payer: Self-pay

## 2019-03-16 DIAGNOSIS — K219 Gastro-esophageal reflux disease without esophagitis: Secondary | ICD-10-CM | POA: Diagnosis not present

## 2019-03-16 DIAGNOSIS — R0789 Other chest pain: Secondary | ICD-10-CM | POA: Insufficient documentation

## 2019-03-16 DIAGNOSIS — R079 Chest pain, unspecified: Secondary | ICD-10-CM | POA: Diagnosis not present

## 2019-03-16 NOTE — Patient Instructions (Signed)
Please have x-ray of your chest completed.  I have placed the order and they should call you to set a date and time.  I suspect your pain is musculoskeletal.  Avoid straining and heavy lifting.    Continue taking Protonix daily 30 minutes before breakfast for your history of GERD and swallowing trouble.  We will plan to see you back in 3 months for follow-up.  Call if new concerns prior.   Aliene Altes, PA-C [facility]

## 2019-03-18 DIAGNOSIS — K219 Gastro-esophageal reflux disease without esophagitis: Secondary | ICD-10-CM | POA: Insufficient documentation

## 2019-03-18 NOTE — Assessment & Plan Note (Addendum)
78 year old male presenting at the request of Dr. Manuella Ghazi for epigastric pain; however, patient is without epigastric pain or significant upper or lower GI symptoms.  He presents with with 27-month history of xiphoid pain that is slowly improving.  Started after getting out of his recliner and feeling a sharp pain across his lower chest.  Pain is only present with pushing on xiphoid process, lifting, or turning.  Exam with tenderness to palpation of xiphoid process.  No tenderness along the rest of the sternum or ribs.  No tenderness in the epigastric area.  Abdominal exam completely benign.  Patient reports having an ultrasound of his abdomen at [facility] about 2 weeks ago.  I will request these records.   I suspect patient has a muscle strain.  I will obtain a chest x-ray to ensure no hairline fracture. Advised patient to rest, avoid twisting and turning, avoid straining or heavy lifting.  Addendum: Received abdomen ultrasound complete from [facility] with no significant abnormality.  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

## 2019-03-18 NOTE — Assessment & Plan Note (Addendum)
Patient has chronic history of GERD.  Also with dysphagia in 2016.  EGD in October 2016 with Schatzki's ring s/p dilation, hiatal hernia, normal gastric mucosa, normal first and second portion of the duodenum.  He had been on omeprazole however primary care has switched him to Protonix due to what was thought to be epigastric pain as described below.  Patient is without any breakthrough GERD symptoms or dysphagia.  No other upper or lower GI symptoms at this time.  No alarm symptoms.  Abdominal exam is benign.  Continue Protonix 40 mg daily 30 minutes before breakfast Follow-up in 3 months.

## 2019-03-19 ENCOUNTER — Telehealth: Payer: Self-pay | Admitting: Gastroenterology

## 2019-03-19 NOTE — Telephone Encounter (Signed)
Received abdomen ultrasound complete from [facility] with no significant abnormality.  Multiple simple liver cysts with the largest at 1.8 cm which is stable since 2016.   Received recent labs from PCP on 11/18/2018: CMP: Glucose 92, creatinine 0.88, sodium 144, potassium 4.6, chloride 106, calcium 9.3, albumin 4.0, bilirubin total 0.5, alk phos 63, AST 26, ALT 22. CBC: WBC 5.8, hemoglobin 14.6, platelets 215 Lipid panel: Total cholesterol 143, triglycerides 155 (elevated) HDL 45, LDL 67. TSH: 3.08 (normal) PSA: 5.9 (high)-PCP with plans to follow. 

## 2019-04-19 DIAGNOSIS — Z299 Encounter for prophylactic measures, unspecified: Secondary | ICD-10-CM | POA: Diagnosis not present

## 2019-04-19 DIAGNOSIS — Z6832 Body mass index (BMI) 32.0-32.9, adult: Secondary | ICD-10-CM | POA: Diagnosis not present

## 2019-04-19 DIAGNOSIS — L821 Other seborrheic keratosis: Secondary | ICD-10-CM | POA: Diagnosis not present

## 2019-04-19 DIAGNOSIS — L570 Actinic keratosis: Secondary | ICD-10-CM | POA: Diagnosis not present

## 2019-04-19 DIAGNOSIS — I10 Essential (primary) hypertension: Secondary | ICD-10-CM | POA: Diagnosis not present

## 2019-04-26 DIAGNOSIS — Z23 Encounter for immunization: Secondary | ICD-10-CM | POA: Diagnosis not present

## 2019-05-04 DIAGNOSIS — I10 Essential (primary) hypertension: Secondary | ICD-10-CM | POA: Diagnosis not present

## 2019-05-04 DIAGNOSIS — Z299 Encounter for prophylactic measures, unspecified: Secondary | ICD-10-CM | POA: Diagnosis not present

## 2019-05-04 DIAGNOSIS — Z6832 Body mass index (BMI) 32.0-32.9, adult: Secondary | ICD-10-CM | POA: Diagnosis not present

## 2019-05-04 DIAGNOSIS — L309 Dermatitis, unspecified: Secondary | ICD-10-CM | POA: Diagnosis not present

## 2019-05-04 DIAGNOSIS — F411 Generalized anxiety disorder: Secondary | ICD-10-CM | POA: Diagnosis not present

## 2019-06-28 ENCOUNTER — Ambulatory Visit: Payer: Medicare Other | Admitting: Gastroenterology

## 2019-06-28 DIAGNOSIS — S23428A Other sprain of sternum, initial encounter: Secondary | ICD-10-CM | POA: Diagnosis not present

## 2019-06-28 DIAGNOSIS — K2100 Gastro-esophageal reflux disease with esophagitis, without bleeding: Secondary | ICD-10-CM | POA: Diagnosis not present

## 2019-06-28 DIAGNOSIS — Z6833 Body mass index (BMI) 33.0-33.9, adult: Secondary | ICD-10-CM | POA: Diagnosis not present

## 2019-06-28 DIAGNOSIS — F411 Generalized anxiety disorder: Secondary | ICD-10-CM | POA: Diagnosis not present

## 2019-06-28 DIAGNOSIS — L309 Dermatitis, unspecified: Secondary | ICD-10-CM | POA: Diagnosis not present

## 2019-06-28 DIAGNOSIS — Z299 Encounter for prophylactic measures, unspecified: Secondary | ICD-10-CM | POA: Diagnosis not present

## 2019-06-28 DIAGNOSIS — I10 Essential (primary) hypertension: Secondary | ICD-10-CM | POA: Diagnosis not present

## 2019-07-18 DIAGNOSIS — Z299 Encounter for prophylactic measures, unspecified: Secondary | ICD-10-CM | POA: Diagnosis not present

## 2019-07-18 DIAGNOSIS — R21 Rash and other nonspecific skin eruption: Secondary | ICD-10-CM | POA: Diagnosis not present

## 2019-07-18 DIAGNOSIS — Z87891 Personal history of nicotine dependence: Secondary | ICD-10-CM | POA: Diagnosis not present

## 2019-07-18 DIAGNOSIS — I10 Essential (primary) hypertension: Secondary | ICD-10-CM | POA: Diagnosis not present

## 2019-07-18 DIAGNOSIS — K219 Gastro-esophageal reflux disease without esophagitis: Secondary | ICD-10-CM | POA: Diagnosis not present

## 2019-07-18 DIAGNOSIS — Z6833 Body mass index (BMI) 33.0-33.9, adult: Secondary | ICD-10-CM | POA: Diagnosis not present

## 2019-07-27 DIAGNOSIS — Z6832 Body mass index (BMI) 32.0-32.9, adult: Secondary | ICD-10-CM | POA: Diagnosis not present

## 2019-07-27 DIAGNOSIS — E7800 Pure hypercholesterolemia, unspecified: Secondary | ICD-10-CM | POA: Diagnosis not present

## 2019-07-27 DIAGNOSIS — I10 Essential (primary) hypertension: Secondary | ICD-10-CM | POA: Diagnosis not present

## 2019-07-27 DIAGNOSIS — R21 Rash and other nonspecific skin eruption: Secondary | ICD-10-CM | POA: Diagnosis not present

## 2019-07-27 DIAGNOSIS — K219 Gastro-esophageal reflux disease without esophagitis: Secondary | ICD-10-CM | POA: Diagnosis not present

## 2019-07-27 DIAGNOSIS — Z87891 Personal history of nicotine dependence: Secondary | ICD-10-CM | POA: Diagnosis not present

## 2019-07-27 DIAGNOSIS — Z299 Encounter for prophylactic measures, unspecified: Secondary | ICD-10-CM | POA: Diagnosis not present

## 2019-08-18 DIAGNOSIS — Z23 Encounter for immunization: Secondary | ICD-10-CM | POA: Diagnosis not present

## 2019-09-13 DIAGNOSIS — Z299 Encounter for prophylactic measures, unspecified: Secondary | ICD-10-CM | POA: Diagnosis not present

## 2019-09-13 DIAGNOSIS — I10 Essential (primary) hypertension: Secondary | ICD-10-CM | POA: Diagnosis not present

## 2019-09-13 DIAGNOSIS — R21 Rash and other nonspecific skin eruption: Secondary | ICD-10-CM | POA: Diagnosis not present

## 2019-09-13 DIAGNOSIS — Z6832 Body mass index (BMI) 32.0-32.9, adult: Secondary | ICD-10-CM | POA: Diagnosis not present

## 2019-09-13 DIAGNOSIS — G5139 Clonic hemifacial spasm, unspecified: Secondary | ICD-10-CM | POA: Diagnosis not present

## 2019-09-16 DIAGNOSIS — Z23 Encounter for immunization: Secondary | ICD-10-CM | POA: Diagnosis not present

## 2019-10-10 DIAGNOSIS — Z87891 Personal history of nicotine dependence: Secondary | ICD-10-CM | POA: Diagnosis not present

## 2019-10-10 DIAGNOSIS — R350 Frequency of micturition: Secondary | ICD-10-CM | POA: Diagnosis not present

## 2019-10-10 DIAGNOSIS — K649 Unspecified hemorrhoids: Secondary | ICD-10-CM | POA: Diagnosis not present

## 2019-10-10 DIAGNOSIS — Z299 Encounter for prophylactic measures, unspecified: Secondary | ICD-10-CM | POA: Diagnosis not present

## 2019-10-10 DIAGNOSIS — Z6832 Body mass index (BMI) 32.0-32.9, adult: Secondary | ICD-10-CM | POA: Diagnosis not present

## 2019-10-10 DIAGNOSIS — N400 Enlarged prostate without lower urinary tract symptoms: Secondary | ICD-10-CM | POA: Diagnosis not present

## 2019-10-10 DIAGNOSIS — I10 Essential (primary) hypertension: Secondary | ICD-10-CM | POA: Diagnosis not present

## 2019-11-21 DIAGNOSIS — Z1211 Encounter for screening for malignant neoplasm of colon: Secondary | ICD-10-CM | POA: Diagnosis not present

## 2019-11-21 DIAGNOSIS — Z7189 Other specified counseling: Secondary | ICD-10-CM | POA: Diagnosis not present

## 2019-11-21 DIAGNOSIS — R5383 Other fatigue: Secondary | ICD-10-CM | POA: Diagnosis not present

## 2019-11-21 DIAGNOSIS — Z125 Encounter for screening for malignant neoplasm of prostate: Secondary | ICD-10-CM | POA: Diagnosis not present

## 2019-11-21 DIAGNOSIS — Z79899 Other long term (current) drug therapy: Secondary | ICD-10-CM | POA: Diagnosis not present

## 2019-11-21 DIAGNOSIS — Z1331 Encounter for screening for depression: Secondary | ICD-10-CM | POA: Diagnosis not present

## 2019-11-21 DIAGNOSIS — Z0000 Encounter for general adult medical examination without abnormal findings: Secondary | ICD-10-CM | POA: Diagnosis not present

## 2019-11-21 DIAGNOSIS — E7800 Pure hypercholesterolemia, unspecified: Secondary | ICD-10-CM | POA: Diagnosis not present

## 2019-11-21 DIAGNOSIS — Z1339 Encounter for screening examination for other mental health and behavioral disorders: Secondary | ICD-10-CM | POA: Diagnosis not present

## 2019-11-21 DIAGNOSIS — I739 Peripheral vascular disease, unspecified: Secondary | ICD-10-CM | POA: Diagnosis not present

## 2019-11-21 DIAGNOSIS — I10 Essential (primary) hypertension: Secondary | ICD-10-CM | POA: Diagnosis not present

## 2019-11-21 DIAGNOSIS — Z299 Encounter for prophylactic measures, unspecified: Secondary | ICD-10-CM | POA: Diagnosis not present

## 2019-11-26 ENCOUNTER — Other Ambulatory Visit: Payer: Self-pay

## 2019-11-26 ENCOUNTER — Emergency Department (HOSPITAL_COMMUNITY)
Admission: EM | Admit: 2019-11-26 | Discharge: 2019-11-26 | Disposition: A | Discharge status: Home / Self Care | Payer: Medicare Other | Attending: Emergency Medicine | Admitting: Emergency Medicine

## 2019-11-26 DIAGNOSIS — R300 Dysuria: Secondary | ICD-10-CM | POA: Insufficient documentation

## 2019-11-26 DIAGNOSIS — R361 Hematospermia: Secondary | ICD-10-CM

## 2019-11-26 DIAGNOSIS — R972 Elevated prostate specific antigen [PSA]: Secondary | ICD-10-CM | POA: Insufficient documentation

## 2019-11-26 DIAGNOSIS — N5089 Other specified disorders of the male genital organs: Secondary | ICD-10-CM | POA: Diagnosis not present

## 2019-11-26 DIAGNOSIS — Z79899 Other long term (current) drug therapy: Secondary | ICD-10-CM | POA: Insufficient documentation

## 2019-11-26 DIAGNOSIS — I10 Essential (primary) hypertension: Secondary | ICD-10-CM | POA: Diagnosis not present

## 2019-11-26 DIAGNOSIS — R319 Hematuria, unspecified: Secondary | ICD-10-CM

## 2019-11-26 DIAGNOSIS — N5319 Other ejaculatory dysfunction: Secondary | ICD-10-CM | POA: Diagnosis not present

## 2019-11-26 LAB — CBC WITH DIFFERENTIAL/PLATELET
Abs Immature Granulocytes: 0.02 10*3/uL (ref 0.00–0.07)
Basophils Absolute: 0.1 10*3/uL (ref 0.0–0.1)
Basophils Relative: 1 %
Eosinophils Absolute: 0.3 10*3/uL (ref 0.0–0.5)
Eosinophils Relative: 3 %
HCT: 45.8 % (ref 39.0–52.0)
Hemoglobin: 15.2 g/dL (ref 13.0–17.0)
Immature Granulocytes: 0 %
Lymphocytes Relative: 28 %
Lymphs Abs: 2.5 10*3/uL (ref 0.7–4.0)
MCH: 31.7 pg (ref 26.0–34.0)
MCHC: 33.2 g/dL (ref 30.0–36.0)
MCV: 95.4 fL (ref 80.0–100.0)
Monocytes Absolute: 1.1 10*3/uL — ABNORMAL HIGH (ref 0.1–1.0)
Monocytes Relative: 12 %
Neutro Abs: 4.9 10*3/uL (ref 1.7–7.7)
Neutrophils Relative %: 56 %
Platelets: 245 10*3/uL (ref 150–400)
RBC: 4.8 MIL/uL (ref 4.22–5.81)
RDW: 13 % (ref 11.5–15.5)
WBC: 8.8 10*3/uL (ref 4.0–10.5)
nRBC: 0 % (ref 0.0–0.2)

## 2019-11-26 LAB — COMPREHENSIVE METABOLIC PANEL
ALT: 22 U/L (ref 0–44)
AST: 23 U/L (ref 15–41)
Albumin: 3.7 g/dL (ref 3.5–5.0)
Alkaline Phosphatase: 73 U/L (ref 38–126)
Anion gap: 7 (ref 5–15)
BUN: 12 mg/dL (ref 8–23)
CO2: 25 mmol/L (ref 22–32)
Calcium: 8.7 mg/dL — ABNORMAL LOW (ref 8.9–10.3)
Chloride: 101 mmol/L (ref 98–111)
Creatinine, Ser: 0.9 mg/dL (ref 0.61–1.24)
GFR calc Af Amer: >60 mL/min (ref >60)
GFR calc non Af Amer: >60 mL/min (ref >60)
Glucose, Bld: 104 mg/dL — ABNORMAL HIGH (ref 70–99)
Potassium: 3.6 mmol/L (ref 3.5–5.1)
Sodium: 133 mmol/L — ABNORMAL LOW (ref 135–145)
Total Bilirubin: 0.5 mg/dL (ref 0.3–1.2)
Total Protein: 7 g/dL (ref 6.5–8.1)

## 2019-11-26 LAB — URINALYSIS, ROUTINE W REFLEX MICROSCOPIC
Bacteria, UA: NONE SEEN
Bilirubin Urine: NEGATIVE
Glucose, UA: NEGATIVE mg/dL
Ketones, ur: NEGATIVE mg/dL
Leukocytes,Ua: NEGATIVE
Nitrite: NEGATIVE
Protein, ur: NEGATIVE mg/dL
RBC / HPF: >50 RBC/hpf — ABNORMAL HIGH (ref 0–5)
Specific Gravity, Urine: 1.005 (ref 1.005–1.030)
pH: 6 (ref 5.0–8.0)

## 2019-11-26 NOTE — ED Triage Notes (Signed)
Pt report of hematuria since 1600  States he has voided 2 times since and the last "was better"  Report of burning with urination   Has no urologist   Reports R flank pain for the last "couple of days but today it hasn't bothered me"

## 2019-11-26 NOTE — ED Provider Notes (Signed)
[facility] EMERGENCY DEPARTMENT Provider Note   CSN: [id number] Arrival date & time: 11/26/19  1944     History Chief Complaint  Patient presents with  . Hematuria    Scott Owens is a 79 y.o. male.  Scott Owens is a 79 y.o. male with a history of hypertension, GERD, migraines, chronic back pain, who presents to the emergency department for evaluation of hematuria.  He reports that he first noticed this around 4 PM after he was sexually active with his wife.  He reports he noticed some blood in his ejaculate and then blood in his urine afterwards.  He reported some mild dysuria with first urination.  He reports throughout the evening when he is urinated blood seems to be less and less and he denies any continued dysuria or urinary frequency.  Denies any fevers or chills.  No abdominal or suprapubic pain.  He denies any rectal or perineal pain.  Has not noticed any rectal bleeding.  Denies penile or testicular pain or swelling.  States he has never had blood in his urine or ejaculate before.  It does report that he had had some intermittent right-sided flank pain over the past 2 days, but that has not been bothering him much at all today and he currently denies any pain.  Reports that he recently had a physical done and was found to have an elevated PSA, states that they are keeping an eye on this.  He has had prostate infections previously, but does not have the typical pain he has had with these and has never had hematuria with these.  Reports remote history many years ago of kidney stone.  Patient is not on any blood thinners.  No other aggravating or alleviating factors        Past Medical History:  Diagnosis Date  . Anxiety   . Chronic back pain   . Diarrhea   . Facial spasm    left side.  Marland Kitchen GERD (gastroesophageal reflux disease)   . History of hiatal hernia   . HTN (hypertension)   . Migraines     Patient Active Problem List   Diagnosis Date Noted  . GERD  (gastroesophageal reflux disease) 03/18/2019  . Xiphoid pain 03/16/2019  . Abdominal pain, epigastric 03/15/2019  . Heme positive stool 12/03/2016  . Hiatal hernia   . Schatzki's ring   . Abdominal pain 04/25/2015    Past Surgical History:  Procedure Laterality Date  . CHOLECYSTECTOMY N/A 05/24/2015   Procedure: LAPAROSCOPIC CHOLECYSTECTOMY;  Surgeon: Aviva Signs, MD;  Location: [facility];  Service: General;  Laterality: N/A;  . COLONOSCOPY     in 2010 by Dr. Anthony Sar per patient   . COLONOSCOPY N/A 01/20/2017   Procedure: COLONOSCOPY;  Surgeon: Daneil Dolin, MD;  Location: [facility];  Service: Endoscopy;  Laterality: N/A;  945   . ESOPHAGOGASTRODUODENOSCOPY     remote past by Dr. Gala Romney   . ESOPHAGOGASTRODUODENOSCOPY N/A 04/29/2015   Dr. Gareth Morgan ring s/p dilation/hiatal hernia  . POLYPECTOMY  01/20/2017   Procedure: POLYPECTOMY;  Surgeon: Daneil Dolin, MD;  Location: [facility];  Service: Endoscopy;;  Ascending colon polyp cs, splenic flexure polyp hs  . right ear surgery     ruptured ear drum  . TONSILLECTOMY         Family History  Problem Relation Age of Onset  . Colon cancer Neg Hx   . Colon polyps Neg Hx     Social History  Tobacco Use  . Smoking status: Former Smoker    Packs/day: 1.00    Years: 20.00    Pack years: 20.00    Types: Cigarettes    Quit date: 05/20/1978    Years since quitting: 41.5  . Smokeless tobacco: Never Used  Substance Use Topics  . Alcohol use: No    Alcohol/week: 0.0 standard drinks  . Drug use: No    Home Medications Prior to Admission medications   Medication Sig Start Date End Date Taking? Authorizing Provider  acetaminophen (TYLENOL) 500 MG tablet Take 1,000 mg by mouth every 6 (six) hours as needed (for back/headaches.).     [provider]  clonazePAM (KLONOPIN) 0.5 MG tablet Take 0.5 mg by mouth at bedtime.     [provider]  divalproex (DEPAKOTE ER) 500 MG 24 hr tablet Take 500 mg by  mouth daily before breakfast.  11/09/16   [provider]  fluticasone (FLONASE) 50 MCG/ACT nasal spray Place 2 sprays into both nostrils daily as needed for allergies or rhinitis.    [provider]  loperamide (IMODIUM) 2 MG capsule Take by mouth as needed for diarrhea or loose stools.    [provider]  meclizine (ANTIVERT) 25 MG tablet Take 25 mg by mouth 3 (three) times daily as needed. For dizziness 11/09/16   [provider]  metoprolol succinate (TOPROL-XL) 50 MG 24 hr tablet Take 50 mg by mouth daily at 2 PM.  10/01/16   [provider]  OnabotulinumtoxinA (BOTOX IJ) Inject as directed every 4 (four) months. For facial spasms/migraines.    [provider]  pantoprazole (PROTONIX) 40 MG tablet Take 40 mg by mouth daily.    [provider]  tamsulosin (FLOMAX) 0.4 MG CAPS capsule Take 0.4 mg by mouth daily.    [provider]  triamcinolone cream (KENALOG) 0.1 % Apply 1 application topically 2 (two) times daily as needed (for ezcema).  03/08/15   [provider]    Allergies    Bupropion, Hydrocodone-acetaminophen, Oxycodone, Sulfa antibiotics, Temazepam, Zolpidem, and Latex  Review of Systems   Review of Systems  Constitutional: Negative for chills and fever.  HENT: Negative.   Respiratory: Negative for cough and shortness of breath.   Cardiovascular: Negative for chest pain.  Gastrointestinal: Negative for abdominal pain, diarrhea, nausea and vomiting.  Genitourinary: Positive for flank pain and hematuria. Negative for difficulty urinating, discharge, dysuria, frequency, penile pain, penile swelling, scrotal swelling and testicular pain.  Musculoskeletal: Negative for arthralgias, back pain and myalgias.  Skin: Negative for color change and rash.  Neurological: Negative for dizziness, syncope and light-headedness.  All other systems reviewed and are negative.   Physical Exam Updated Vital Signs BP (!)  187/93 (BP Location: Right Arm)   Pulse 70   Temp 98.4 F (36.9 C) (Oral)   Resp 18   Ht 6' (1.829 m)   Wt 106.9 kg   SpO2 96%   BMI 31.95 kg/m   Physical Exam Vitals and nursing note reviewed.  Constitutional:      General: He is not in acute distress.    Appearance: Normal appearance. He is well-developed and normal weight. He is not ill-appearing or diaphoretic.  HENT:     Head: Normocephalic and atraumatic.  Eyes:     General:        Right eye: No discharge.        Left eye: No discharge.     Pupils: Pupils are equal, round, and  reactive to light.  Cardiovascular:     Rate and Rhythm: Normal rate and regular rhythm.     Pulses: Normal pulses.     Heart sounds: Normal heart sounds. No murmur. No friction rub. No gallop.   Pulmonary:     Effort: Pulmonary effort is normal. No respiratory distress.     Breath sounds: Normal breath sounds. No wheezing or rales.     Comments: Respirations equal and unlabored, patient able to speak in full sentences, lungs clear to auscultation bilaterally Abdominal:     General: Bowel sounds are normal. There is no distension.     Palpations: Abdomen is soft. There is no mass.     Tenderness: There is no abdominal tenderness. There is no guarding.     Comments: Abdomen soft, nondistended, nontender to palpation in all quadrants without guarding or peritoneal signs, no CVA tenderness bilaterally  Genitourinary:    Comments: GU exam performed by male provider per patient preference. Penis appears normal, no gross blood or discharge noted.  No penile tenderness.  No testicular tenderness, swelling or masses noted. Musculoskeletal:        General: No deformity.     Cervical back: Neck supple.  Skin:    General: Skin is warm and dry.     Capillary Refill: Capillary refill takes less than 2 seconds.  Neurological:     Mental Status: He is alert.     Coordination: Coordination normal.     Comments: Speech is clear, able to follow commands CN  III-XII intact Normal strength in upper and lower extremities bilaterally including dorsiflexion and plantar flexion, strong and equal grip strength Sensation normal to light and sharp touch Moves extremities without ataxia, coordination intact   Psychiatric:        Mood and Affect: Mood normal.        Behavior: Behavior normal.     ED Results / Procedures / Treatments   Labs (all labs ordered are listed, but only abnormal results are displayed) Labs Reviewed  COMPREHENSIVE METABOLIC PANEL - Abnormal; Notable for the following components:      Result Value   Sodium 133 (*)    Glucose, Bld 104 (*)    Calcium 8.7 (*)    All other components within normal limits  CBC WITH DIFFERENTIAL/PLATELET - Abnormal; Notable for the following components:   Monocytes Absolute 1.1 (*)    All other components within normal limits  URINALYSIS, ROUTINE W REFLEX MICROSCOPIC - Abnormal; Notable for the following components:   Hgb urine dipstick LARGE (*)    RBC / HPF >50 (*)    All other components within normal limits  URINE CULTURE    EKG None  Radiology No results found.  Procedures Procedures (including critical care time)  Medications Ordered in ED Medications - No data to display  ED Course  I have reviewed the triage vital signs and the nursing notes.  Pertinent labs & imaging results that were available during my care of the patient were reviewed by me and considered in my medical decision making (see chart for details).    MDM Rules/Calculators/A&P                     79 year old male presents with episode of hematuria and bloody ejaculate today, no prior history.  Had some intermittent right flank pain over the past 2 days but this has not bothered him at all today.  He denies dysuria or frequency.  No fevers  or chills.  No abdominal tenderness on exam.  Aside from hypertension vitals are normal and patient is well-appearing.  History of recently elevated PSA.  No penile  tenderness, scrotal pain or swelling on exam.  No perineal pain.  Doubt epididymitis, orchitis, or prostatitis.  Patient may have had recently passed stone, but given that he no longer has any flank pain tenderness discussed with patient and he does not want to proceed with CT renal stone study today.  Will check labs and urinalysis.  I have independently ordered, reviewed and interpreted all labs: CBC: No leukocytosis, normal hemoglobin. CMP: Sodium 133, glucose 104, mild hypocalcemia 8.7, no other electrolyte derangements, normal renal and liver function UA: Large hemoglobin with greater than 50 RBCs, a few WBCs present but no bacteria seen, no leukocytes or nitrites, low suspicion for infection but culture sent.  Work appears reassuring, question whether patient had recently passed kidney stone.  We will have him follow-up with urology for further evaluation of hematuria.  At this time there does not appear to be any evidence of an acute emergency medical condition and the patient appears stable for discharge with appropriate outpatient follow up. Diagnosis was discussed with patient who verbalizes understanding and is agreeable to discharge. Pt case discussed with Dr. Alvino Chapel who agrees with my plan.   Final Clinical Impression(s) / ED Diagnoses Final diagnoses:  Hematuria, unspecified type  Bloody ejaculation    Rx / DC Orders ED Discharge Orders    None       Janet Berlin 11/26/19 2209    Davonna Belling, MD 11/26/19 2333

## 2019-11-26 NOTE — Discharge Instructions (Signed)
Your lab work today is overall reassuring, kidney function is normal, your urinalysis shows blood without other obvious signs of infection.  We have sent off for culture, please call Monday morning to schedule follow-up appointment with urology.  Return to the emergency department if you have increasing amounts of blood in your urine, pain with urination, fevers, flank pain, difficulty urinating or any other new or concerning symptoms.

## 2019-11-28 DIAGNOSIS — N419 Inflammatory disease of prostate, unspecified: Secondary | ICD-10-CM | POA: Diagnosis not present

## 2019-11-28 DIAGNOSIS — Z6832 Body mass index (BMI) 32.0-32.9, adult: Secondary | ICD-10-CM | POA: Diagnosis not present

## 2019-11-28 DIAGNOSIS — I10 Essential (primary) hypertension: Secondary | ICD-10-CM | POA: Diagnosis not present

## 2019-11-28 DIAGNOSIS — Z299 Encounter for prophylactic measures, unspecified: Secondary | ICD-10-CM | POA: Diagnosis not present

## 2019-11-28 DIAGNOSIS — R350 Frequency of micturition: Secondary | ICD-10-CM | POA: Diagnosis not present

## 2019-11-28 DIAGNOSIS — I739 Peripheral vascular disease, unspecified: Secondary | ICD-10-CM | POA: Diagnosis not present

## 2019-11-28 LAB — URINE CULTURE: Culture: NO GROWTH

## 2019-12-12 ENCOUNTER — Encounter: Payer: Self-pay | Admitting: Internal Medicine

## 2020-01-17 ENCOUNTER — Encounter: Payer: Self-pay | Admitting: Urology

## 2020-01-17 ENCOUNTER — Other Ambulatory Visit: Payer: Self-pay

## 2020-01-17 ENCOUNTER — Ambulatory Visit (INDEPENDENT_AMBULATORY_CARE_PROVIDER_SITE_OTHER): Payer: Medicare Other | Admitting: Urology

## 2020-01-17 VITALS — BP 153/85 | HR 73 | Temp 97.7°F | Ht 72.0 in | Wt 235.0 lb

## 2020-01-17 DIAGNOSIS — R319 Hematuria, unspecified: Secondary | ICD-10-CM | POA: Diagnosis not present

## 2020-01-17 LAB — POCT URINALYSIS DIPSTICK
Bilirubin, UA: NEGATIVE
Glucose, UA: NEGATIVE
Ketones, UA: NEGATIVE
Leukocytes, UA: NEGATIVE
Nitrite, UA: NEGATIVE
Protein, UA: NEGATIVE
Spec Grav, UA: <=1.005 — AB (ref 1.010–1.025)
Urobilinogen, UA: 0.2 E.U./dL
pH, UA: 7.5 (ref 5.0–8.0)

## 2020-01-17 MED ORDER — FINASTERIDE 5 MG PO TABS: 5.0000 mg | ORAL_TABLET | Freq: Every day | ORAL | 3 refills | Status: AC

## 2020-01-17 NOTE — Patient Instructions (Signed)
Hematuria, Adult Hematuria is blood in the urine. Blood may be visible in the urine, or it may be identified with a test. This condition can be caused by infections of the bladder, urethra, kidney, or prostate. Other possible causes include:  Kidney stones.  Cancer of the urinary tract.  Too much calcium in the urine.  Conditions that are passed from parent to child (inherited conditions).  Exercise that requires a lot of energy. Infections can usually be treated with medicine, and a kidney stone usually will pass through your urine. If neither of these is the cause of your hematuria, more tests may be needed to identify the cause of your symptoms. It is very important to tell your health care provider about any blood in your urine, even if it is painless or the blood stops without treatment. Blood in the urine, when it happens and then stops and then happens again, can be a symptom of a very serious condition, including cancer. There is no pain in the initial stages of many urinary cancers. Follow these instructions at home: Medicines  Take over-the-counter and prescription medicines only as told by your health care provider.  If you were prescribed an antibiotic medicine, take it as told by your health care provider. Do not stop taking the antibiotic even if you start to feel better. Eating and drinking  Drink enough fluid to keep your urine clear or pale yellow. It is recommended that you drink 3-4 quarts (2.8-3.8 L) a day. If you have been diagnosed with an infection, it is recommended that you drink cranberry juice in addition to large amounts of water.  Avoid caffeine, tea, and carbonated beverages. These tend to irritate the bladder.  Avoid alcohol because it may irritate the prostate (men). General instructions  If you have been diagnosed with a kidney stone, follow your health care provider's instructions about straining your urine to catch the stone.  Empty your bladder  often. Avoid holding urine for long periods of time.  If you are male: ? After a bowel movement, wipe from front to back and use each piece of toilet paper only once. ? Empty your bladder before and after sex.  Pay attention to any changes in your symptoms. Tell your health care provider about any changes or any new symptoms.  It is your responsibility to get your test results. Ask your health care provider, or the department performing the test, when your results will be ready.  Keep all follow-up visits as told by your health care provider. This is important. Contact a health care provider if:  You develop back pain.  You have a fever.  You have nausea or vomiting.  Your symptoms do not improve after 3 days.  Your symptoms get worse. Get help right away if:  You develop severe vomiting and are unable take medicine without vomiting.  You develop severe pain in your back or abdomen even though you are taking medicine.  You pass a large amount of blood in your urine.  You pass blood clots in your urine.  You feel very weak or like you might faint.  You faint. Summary  Hematuria is blood in the urine. It has many possible causes.  It is very important that you tell your health care provider about any blood in your urine, even if it is painless or the blood stops without treatment.  Take over-the-counter and prescription medicines only as told by your health care provider.  Drink enough fluid to keep   your urine clear or pale yellow. This information is not intended to replace advice given to you by your health care provider. Make sure you discuss any questions you have with your health care provider. Document Revised: 11/23/2018 Document Reviewed: 08/01/2016 Elsevier Patient Education  2020 Elsevier Inc.  

## 2020-01-17 NOTE — Progress Notes (Signed)
Urological Symptom Review  Patient is experiencing the following symptoms: Frequent urination Hard to postpone urination Get up at night to urinate Leakage of urine Stream starts and stops  Kidney stones    Review of Systems  Gastrointestinal (upper)  : Negative for upper GI symptoms  Gastrointestinal (lower) : Negative for lower GI symptoms  Constitutional : Negative for symptoms  Skin: Negative for skin symptoms  Eyes: Negative for eye symptoms  Ear/Nose/Throat : Negative for Ear/Nose/Throat symptoms  Hematologic/Lymphatic: Negative for Hematologic/Lymphatic symptoms  Cardiovascular : Negative for cardiovascular symptoms  Respiratory : Negative for respiratory symptoms  Endocrine: Negative for endocrine symptoms  Musculoskeletal: Back pain  Neurological: Headaches Dizziness  Psychologic: Depression Anxiety

## 2020-01-17 NOTE — Progress Notes (Signed)
01/17/2020 9:29 AM   Scott Owens 11/26/1940 [id number]  Referring provider: Monico Blitz, MD [address] 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 In 11/2019 he developed hematospermia and then had hematuria for 3 voids after the hematospermia. He previously had a hematuria evaluation with Dr. Clifton Custard which was negative. He does have a hx of nephrolithiasis treated by Dr. Michela Pitcher. UA today shows trace blood. He has mild LUTS including nocturia 1-2x. He takes flomax 0.4mg  daily. No dysuria. Very remote tobacco hx, quit 40 years ago. He does not take on blood thinners. He gets UTIs 1-2x per year   PMH: Past Medical History:  Diagnosis Date  . Anxiety   . Chronic back pain   . Diarrhea   . Facial spasm    left side.  Marland Kitchen GERD (gastroesophageal reflux disease)   . History of hiatal hernia   . HTN (hypertension)   . Migraines     Surgical History: Past Surgical History:  Procedure Laterality Date  . CHOLECYSTECTOMY N/A 05/24/2015   Procedure: LAPAROSCOPIC CHOLECYSTECTOMY;  Surgeon: Aviva Signs, MD;  Location: [facility];  Service: General;  Laterality: N/A;  . COLONOSCOPY     in 2010 by Dr. Anthony Sar per patient   . COLONOSCOPY N/A 01/20/2017   Procedure: COLONOSCOPY;  Surgeon: Daneil Dolin, MD;  Location: [facility];  Service: Endoscopy;  Laterality: N/A;  945   . ESOPHAGOGASTRODUODENOSCOPY     remote past by Dr. Gala Romney   . ESOPHAGOGASTRODUODENOSCOPY N/A 04/29/2015   Dr. Gareth Owens ring s/p dilation/hiatal hernia  . POLYPECTOMY  01/20/2017   Procedure: POLYPECTOMY;  Surgeon: Daneil Dolin, MD;  Location: [facility];  Service: Endoscopy;;  Ascending colon polyp cs, splenic flexure polyp hs  . right ear surgery     ruptured ear drum  . TONSILLECTOMY      Home Medications:  Allergies as of 01/17/2020      Reactions   Bupropion    irritable   Hydrocodone-acetaminophen    MIGRAINE   Oxycodone    MIGRAINE    Sulfa Antibiotics Other (See Comments)   unknown   Temazepam    HEADACHE   Zolpidem    HEADACHE   Latex Rash      Medication List       Accurate as of January 17, 2020  9:29 AM. If you have any questions, ask your nurse or doctor.        acetaminophen 500 MG tablet Commonly known as: TYLENOL Take 1,000 mg by mouth every 6 (six) hours as needed (for back/headaches.).   alfuzosin 10 MG 24 hr tablet Commonly known as: UROXATRAL Take 10 mg by mouth daily.   BOTOX IJ Inject as directed every 4 (four) months. For facial spasms/migraines.   clonazePAM 0.5 MG tablet Commonly known as: KLONOPIN Take 0.5 mg by mouth at bedtime.   divalproex 500 MG 24 hr tablet Commonly known as: DEPAKOTE ER Take 500 mg by mouth daily before breakfast. What changed: Another medication with the same name was removed. Continue taking this medication, and follow the directions you see here. Changed by: Nicolette Bang, MD   fluticasone 50 MCG/ACT nasal spray Commonly known as: FLONASE Place 2 sprays into both nostrils daily as needed for allergies or rhinitis.   loperamide 2 MG capsule Commonly known as: IMODIUM Take by mouth as needed for diarrhea or loose stools.   meclizine 25 MG tablet Commonly  known as: ANTIVERT Take 25 mg by mouth 3 (three) times daily as needed. For dizziness   metoprolol succinate 50 MG 24 hr tablet Commonly known as: TOPROL-XL Take 50 mg by mouth daily at 2 PM.   pantoprazole 40 MG tablet Commonly known as: PROTONIX Take 40 mg by mouth daily.   tamsulosin 0.4 MG Caps capsule Commonly known as: FLOMAX Take 0.4 mg by mouth daily.   triamcinolone cream 0.1 % Commonly known as: KENALOG Apply 1 application topically 2 (two) times daily as needed (for ezcema).       Allergies:  Allergies  Allergen Reactions  . Bupropion     irritable  . Hydrocodone-Acetaminophen     MIGRAINE  . Oxycodone     MIGRAINE  . Sulfa Antibiotics Other (See Comments)    unknown    . Temazepam     HEADACHE  . Zolpidem     HEADACHE  . Latex Rash    Family History: Family History  Problem Relation Age of Onset  . Ovarian cancer Mother   . Colon cancer Neg Hx   . Colon polyps Neg Hx     Social History:  reports that he quit smoking about 41 years ago. His smoking use included cigarettes. He has a 20.00 pack-year smoking history. He has never used smokeless tobacco. He reports that he does not drink alcohol and does not use drugs.  ROS: All other review of systems were reviewed and are negative except what is noted above in HPI  Physical Exam: BP (!) 153/85   Pulse 73   Temp 97.7 F (36.5 C)   Ht 6' (1.829 m)   Wt 235 lb (106.6 kg)   BMI 31.87 kg/m   Constitutional:  Alert and oriented, No acute distress. HEENT: [place] AT, moist mucus membranes.  Trachea midline, no masses. Cardiovascular: No clubbing, cyanosis, or edema. Respiratory: Normal respiratory effort, no increased work of breathing. GI: Abdomen is soft, nontender, nondistended, no abdominal masses GU: No CVA tenderness. uncircumcised phallus. No masses/lesions on penis, testis, scrotum. Prostate 40g smooth no nodules no induration.  Lymph: No cervical or inguinal lymphadenopathy. Skin: No rashes, bruises or suspicious lesions. Neurologic: Grossly intact, no focal deficits, moving all 4 extremities. Psychiatric: Normal mood and affect.  Laboratory Data: Lab Results  Component Value Date   WBC 8.8 11/26/2019   HGB 15.2 11/26/2019   HCT 45.8 11/26/2019   MCV 95.4 11/26/2019   PLT 245 11/26/2019    Lab Results  Component Value Date   CREATININE 0.90 11/26/2019    No results found for: PSA  No results found for: TESTOSTERONE  No results found for: HGBA1C  Urinalysis    Component Value Date/Time   COLORURINE YELLOW 11/26/2019 2003   APPEARANCEUR CLEAR 11/26/2019 2003   LABSPEC 1.005 11/26/2019 2003   PHURINE 6.0 11/26/2019 2003   GLUCOSEU NEGATIVE 11/26/2019 2003   HGBUR LARGE  (A) 11/26/2019 2003   BILIRUBINUR neg 01/17/2020 0904   KETONESUR NEGATIVE 11/26/2019 2003   PROTEINUR Negative 01/17/2020 0904   PROTEINUR NEGATIVE 11/26/2019 2003   UROBILINOGEN 0.2 01/17/2020 0904   NITRITE neg 01/17/2020 0904   NITRITE NEGATIVE 11/26/2019 2003   LEUKOCYTESUR Negative 01/17/2020 0904   LEUKOCYTESUR NEGATIVE 11/26/2019 2003    Lab Results  Component Value Date   BACTERIA NONE SEEN 11/26/2019    Pertinent Imaging:  No results found for this or any previous visit.  No results found for this or any previous visit.  No results found  for this or any previous visit.  No results found for this or any previous visit.  No results found for this or any previous visit.  No results found for this or any previous visit.  No results found for this or any previous visit.  No results found for this or any previous visit.   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 We discussed observation versus 5ARI therapy and the patient elects fro 5ARI therapy. We will start finasteride 5mg  daily - POCT urinalysis dipstick   No follow-ups on file.  Nicolette Bang, MD  [facility] [place]

## 2020-02-21 DIAGNOSIS — R05 Cough: Secondary | ICD-10-CM | POA: Diagnosis not present

## 2020-02-21 DIAGNOSIS — N400 Enlarged prostate without lower urinary tract symptoms: Secondary | ICD-10-CM | POA: Diagnosis not present

## 2020-02-21 DIAGNOSIS — Z299 Encounter for prophylactic measures, unspecified: Secondary | ICD-10-CM | POA: Diagnosis not present

## 2020-02-21 DIAGNOSIS — J90 Pleural effusion, not elsewhere classified: Secondary | ICD-10-CM | POA: Diagnosis not present

## 2020-02-21 DIAGNOSIS — Z6832 Body mass index (BMI) 32.0-32.9, adult: Secondary | ICD-10-CM | POA: Diagnosis not present

## 2020-02-21 DIAGNOSIS — I10 Essential (primary) hypertension: Secondary | ICD-10-CM | POA: Diagnosis not present

## 2020-02-21 DIAGNOSIS — I739 Peripheral vascular disease, unspecified: Secondary | ICD-10-CM | POA: Diagnosis not present

## 2020-02-21 DIAGNOSIS — R079 Chest pain, unspecified: Secondary | ICD-10-CM | POA: Diagnosis not present

## 2020-02-28 DIAGNOSIS — I10 Essential (primary) hypertension: Secondary | ICD-10-CM | POA: Diagnosis not present

## 2020-02-28 DIAGNOSIS — Z6832 Body mass index (BMI) 32.0-32.9, adult: Secondary | ICD-10-CM | POA: Diagnosis not present

## 2020-02-28 DIAGNOSIS — Z299 Encounter for prophylactic measures, unspecified: Secondary | ICD-10-CM | POA: Diagnosis not present

## 2020-02-28 DIAGNOSIS — B029 Zoster without complications: Secondary | ICD-10-CM | POA: Diagnosis not present

## 2020-02-28 DIAGNOSIS — Z713 Dietary counseling and surveillance: Secondary | ICD-10-CM | POA: Diagnosis not present

## 2020-03-28 DIAGNOSIS — H52532 Spasm of accommodation, left eye: Secondary | ICD-10-CM | POA: Diagnosis not present

## 2020-03-28 DIAGNOSIS — G5132 Clonic hemifacial spasm, left: Secondary | ICD-10-CM | POA: Diagnosis not present

## 2020-04-05 DIAGNOSIS — Z23 Encounter for immunization: Secondary | ICD-10-CM | POA: Diagnosis not present

## 2020-05-15 DIAGNOSIS — H9319 Tinnitus, unspecified ear: Secondary | ICD-10-CM | POA: Diagnosis not present

## 2020-05-15 DIAGNOSIS — R0602 Shortness of breath: Secondary | ICD-10-CM | POA: Diagnosis not present

## 2020-05-15 DIAGNOSIS — Z299 Encounter for prophylactic measures, unspecified: Secondary | ICD-10-CM | POA: Diagnosis not present

## 2020-05-15 DIAGNOSIS — I10 Essential (primary) hypertension: Secondary | ICD-10-CM | POA: Diagnosis not present

## 2020-05-15 DIAGNOSIS — F32A Depression, unspecified: Secondary | ICD-10-CM | POA: Diagnosis not present

## 2020-05-15 DIAGNOSIS — Z6832 Body mass index (BMI) 32.0-32.9, adult: Secondary | ICD-10-CM | POA: Diagnosis not present

## 2020-05-27 DIAGNOSIS — R0602 Shortness of breath: Secondary | ICD-10-CM | POA: Diagnosis not present

## 2020-05-30 DIAGNOSIS — Z23 Encounter for immunization: Secondary | ICD-10-CM | POA: Diagnosis not present

## 2020-06-17 DIAGNOSIS — F32A Depression, unspecified: Secondary | ICD-10-CM | POA: Diagnosis not present

## 2020-06-17 DIAGNOSIS — L309 Dermatitis, unspecified: Secondary | ICD-10-CM | POA: Diagnosis not present

## 2020-06-17 DIAGNOSIS — I10 Essential (primary) hypertension: Secondary | ICD-10-CM | POA: Diagnosis not present

## 2020-06-17 DIAGNOSIS — H9319 Tinnitus, unspecified ear: Secondary | ICD-10-CM | POA: Diagnosis not present

## 2020-06-17 DIAGNOSIS — Z299 Encounter for prophylactic measures, unspecified: Secondary | ICD-10-CM | POA: Diagnosis not present

## 2020-06-17 DIAGNOSIS — Z6832 Body mass index (BMI) 32.0-32.9, adult: Secondary | ICD-10-CM | POA: Diagnosis not present

## 2020-07-11 DIAGNOSIS — G5132 Clonic hemifacial spasm, left: Secondary | ICD-10-CM | POA: Diagnosis not present

## 2020-07-24 ENCOUNTER — Ambulatory Visit: Payer: Medicare Other | Admitting: Urology

## 2020-09-25 DIAGNOSIS — Z87891 Personal history of nicotine dependence: Secondary | ICD-10-CM | POA: Diagnosis not present

## 2020-09-25 DIAGNOSIS — N419 Inflammatory disease of prostate, unspecified: Secondary | ICD-10-CM | POA: Diagnosis not present

## 2020-09-25 DIAGNOSIS — Z6832 Body mass index (BMI) 32.0-32.9, adult: Secondary | ICD-10-CM | POA: Diagnosis not present

## 2020-09-25 DIAGNOSIS — I10 Essential (primary) hypertension: Secondary | ICD-10-CM | POA: Diagnosis not present

## 2020-09-25 DIAGNOSIS — Z299 Encounter for prophylactic measures, unspecified: Secondary | ICD-10-CM | POA: Diagnosis not present

## 2020-09-25 DIAGNOSIS — R350 Frequency of micturition: Secondary | ICD-10-CM | POA: Diagnosis not present

## 2020-10-24 DIAGNOSIS — G245 Blepharospasm: Secondary | ICD-10-CM | POA: Diagnosis not present

## 2020-10-24 DIAGNOSIS — G5132 Clonic hemifacial spasm, left: Secondary | ICD-10-CM | POA: Diagnosis not present

## 2020-11-21 DIAGNOSIS — Z125 Encounter for screening for malignant neoplasm of prostate: Secondary | ICD-10-CM | POA: Diagnosis not present

## 2020-11-21 DIAGNOSIS — H9319 Tinnitus, unspecified ear: Secondary | ICD-10-CM | POA: Diagnosis not present

## 2020-11-21 DIAGNOSIS — E7800 Pure hypercholesterolemia, unspecified: Secondary | ICD-10-CM | POA: Diagnosis not present

## 2020-11-21 DIAGNOSIS — Z299 Encounter for prophylactic measures, unspecified: Secondary | ICD-10-CM | POA: Diagnosis not present

## 2020-11-21 DIAGNOSIS — Z6831 Body mass index (BMI) 31.0-31.9, adult: Secondary | ICD-10-CM | POA: Diagnosis not present

## 2020-11-21 DIAGNOSIS — R030 Elevated blood-pressure reading, without diagnosis of hypertension: Secondary | ICD-10-CM | POA: Diagnosis not present

## 2020-11-21 DIAGNOSIS — Z79899 Other long term (current) drug therapy: Secondary | ICD-10-CM | POA: Diagnosis not present

## 2020-11-21 DIAGNOSIS — R5383 Other fatigue: Secondary | ICD-10-CM | POA: Diagnosis not present

## 2020-11-21 DIAGNOSIS — Z0000 Encounter for general adult medical examination without abnormal findings: Secondary | ICD-10-CM | POA: Diagnosis not present

## 2020-11-21 DIAGNOSIS — Z7189 Other specified counseling: Secondary | ICD-10-CM | POA: Diagnosis not present

## 2020-11-21 DIAGNOSIS — Z1331 Encounter for screening for depression: Secondary | ICD-10-CM | POA: Diagnosis not present

## 2020-11-21 DIAGNOSIS — Z1339 Encounter for screening examination for other mental health and behavioral disorders: Secondary | ICD-10-CM | POA: Diagnosis not present

## 2021-01-06 DIAGNOSIS — H2513 Age-related nuclear cataract, bilateral: Secondary | ICD-10-CM | POA: Diagnosis not present

## 2021-01-06 DIAGNOSIS — H524 Presbyopia: Secondary | ICD-10-CM | POA: Diagnosis not present

## 2021-01-30 DIAGNOSIS — G245 Blepharospasm: Secondary | ICD-10-CM | POA: Diagnosis not present

## 2021-01-30 DIAGNOSIS — G5132 Clonic hemifacial spasm, left: Secondary | ICD-10-CM | POA: Diagnosis not present

## 2021-02-27 DIAGNOSIS — I10 Essential (primary) hypertension: Secondary | ICD-10-CM | POA: Diagnosis not present

## 2021-02-27 DIAGNOSIS — R972 Elevated prostate specific antigen [PSA]: Secondary | ICD-10-CM | POA: Diagnosis not present

## 2021-02-27 DIAGNOSIS — Z299 Encounter for prophylactic measures, unspecified: Secondary | ICD-10-CM | POA: Diagnosis not present

## 2021-02-27 DIAGNOSIS — Z6831 Body mass index (BMI) 31.0-31.9, adult: Secondary | ICD-10-CM | POA: Diagnosis not present

## 2021-02-27 DIAGNOSIS — B354 Tinea corporis: Secondary | ICD-10-CM | POA: Diagnosis not present

## 2021-03-26 DIAGNOSIS — R972 Elevated prostate specific antigen [PSA]: Secondary | ICD-10-CM | POA: Diagnosis not present

## 2021-04-03 DIAGNOSIS — H01004 Unspecified blepharitis left upper eyelid: Secondary | ICD-10-CM | POA: Diagnosis not present

## 2021-04-03 DIAGNOSIS — H01002 Unspecified blepharitis right lower eyelid: Secondary | ICD-10-CM | POA: Diagnosis not present

## 2021-04-03 DIAGNOSIS — H43823 Vitreomacular adhesion, bilateral: Secondary | ICD-10-CM | POA: Diagnosis not present

## 2021-04-03 DIAGNOSIS — H01001 Unspecified blepharitis right upper eyelid: Secondary | ICD-10-CM | POA: Diagnosis not present

## 2021-04-03 DIAGNOSIS — H25813 Combined forms of age-related cataract, bilateral: Secondary | ICD-10-CM | POA: Diagnosis not present

## 2021-04-10 DIAGNOSIS — Z23 Encounter for immunization: Secondary | ICD-10-CM | POA: Diagnosis not present

## 2021-04-10 DIAGNOSIS — Z6832 Body mass index (BMI) 32.0-32.9, adult: Secondary | ICD-10-CM | POA: Diagnosis not present

## 2021-04-10 DIAGNOSIS — Z713 Dietary counseling and surveillance: Secondary | ICD-10-CM | POA: Diagnosis not present

## 2021-04-10 DIAGNOSIS — Z299 Encounter for prophylactic measures, unspecified: Secondary | ICD-10-CM | POA: Diagnosis not present

## 2021-04-10 DIAGNOSIS — I10 Essential (primary) hypertension: Secondary | ICD-10-CM | POA: Diagnosis not present

## 2021-04-10 DIAGNOSIS — R972 Elevated prostate specific antigen [PSA]: Secondary | ICD-10-CM | POA: Diagnosis not present

## 2021-04-11 ENCOUNTER — Encounter (HOSPITAL_COMMUNITY): Payer: Self-pay

## 2021-04-11 ENCOUNTER — Other Ambulatory Visit: Payer: Self-pay

## 2021-04-11 ENCOUNTER — Encounter (HOSPITAL_COMMUNITY)
Admission: RE | Admit: 2021-04-11 | Discharge: 2021-04-11 | Disposition: A | Discharge status: Home / Self Care | Payer: Medicare Other | Source: Ambulatory Visit | Attending: Ophthalmology | Admitting: Ophthalmology

## 2021-04-15 NOTE — H&P (Addendum)
Surgical History & Physical  Patient Name: Scott Owens DOB: [birth year]  Surgery: Cataract extraction with intraocular lens implant phacoemulsification; Left Eye  Surgeon: Baruch Goldmann MD Surgery Date:  05-01-2021 Pre-Op Date:  04-03-2021  HPI: A 23 Yr. old male patient is referred by Dr Hassell Done for cataract eval 1. The patient complains of difficulty when viewing TV, reading closed caption, news scrolls on TV, which began 2 years ago. Both eyes are affected. The episode is gradual. The condition's severity increased since last visit. Symptoms occur when the patient is driving, inside, outside and reading. The complaint is associated with glare. This is negatively affecting the patient's quality of life. HPI Completed by Dr. Baruch Goldmann  Medical History: Cataracts  High Blood Pressure Hypertension, Migraine, Hemifacial spasm of left f...  Review of Systems Negative Allergic/Immunologic Negative Cardiovascular Negative Constitutional Negative Ear, Nose, Mouth & Throat Negative Endocrine Negative Eyes Negative Gastrointestinal Negative Genitourinary Negative Hemotologic/Lymphatic Negative Integumentary Negative Musculoskeletal Negative Neurological Negative Psychiatry Negative Respiratory  Social   Former smoker   Medication  Cholesterol, Flomax, Valium, Tamsulosin, Meclizine, Metoprolol, Dualpro EX,   Sx/Procedures Gallbladder, Botox shots left face for facial tick,   Drug Allergies  Sulfa drugs,   History & Physical: Heent: Cataract, Left Eye NECK: supple without bruits LUNGS: lungs clear to auscultation CV: regular rate and rhythm Abdomen: soft and non-tender  Impression & Plan: Assessment: 1.  COMBINED FORMS AGE RELATED CATARACT; Both Eyes (H25.813) 2.  BLEPHARITIS; Right Upper Lid, Right Lower Lid, Left Upper Lid, Left Lower Lid (H01.001, H01.002,H01.004,H01.005) 3.  DERMATOCHALASIS; Right Upper Lid, Left Upper Lid (H02.831, H02.834) 4.  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  Cataract accounts for the patient's decreased vision. This visual impairment is not correctable with a tolerable change in glasses or contact lenses. Cataract surgery with an implantation of a new lens should significantly improve the visual and functional status of the patient. Discussed all risks, benefits, alternatives, and potential complications. Discussed the procedures and recovery. Patient desires to have surgery. A-scan ordered and performed today for intra-ocular lens calculations. The surgery will be performed in order to improve vision for driving, reading, and for eye examinations. Recommend phacoemulsification with intra-ocular lens. Recommend Dextenza for post-operative pain and inflammation. Left Eye. first. Dilates poorly - shugacaine by protocol. Malyugin Ring. Omidira. Flomax.  2.  Recommend regular lid cleaning.  3.  Asymptomatic, recommend observation for now. Findings, prognosis and treatment options reviewed.  4.  On OCT - likely the cause of the subretinal fluid OD and intraretinal fluid OS. Continue to monitor.

## 2021-04-22 ENCOUNTER — Encounter: Payer: Self-pay | Admitting: Urology

## 2021-04-24 ENCOUNTER — Encounter: Payer: Self-pay | Admitting: Urology

## 2021-04-25 ENCOUNTER — Encounter (HOSPITAL_COMMUNITY)
Admission: RE | Admit: 2021-04-25 | Discharge: 2021-04-25 | Disposition: A | Discharge status: Home / Self Care | Payer: Medicare Other | Source: Ambulatory Visit | Attending: Ophthalmology | Admitting: Ophthalmology

## 2021-04-25 ENCOUNTER — Other Ambulatory Visit: Payer: Self-pay

## 2021-04-25 DIAGNOSIS — I739 Peripheral vascular disease, unspecified: Secondary | ICD-10-CM | POA: Diagnosis not present

## 2021-04-25 DIAGNOSIS — Z6832 Body mass index (BMI) 32.0-32.9, adult: Secondary | ICD-10-CM | POA: Diagnosis not present

## 2021-04-25 DIAGNOSIS — H9392 Unspecified disorder of left ear: Secondary | ICD-10-CM | POA: Diagnosis not present

## 2021-04-25 DIAGNOSIS — I10 Essential (primary) hypertension: Secondary | ICD-10-CM | POA: Diagnosis not present

## 2021-04-25 DIAGNOSIS — Z299 Encounter for prophylactic measures, unspecified: Secondary | ICD-10-CM | POA: Diagnosis not present

## 2021-04-28 DIAGNOSIS — H25811 Combined forms of age-related cataract, right eye: Secondary | ICD-10-CM | POA: Diagnosis not present

## 2021-04-28 DIAGNOSIS — H25812 Combined forms of age-related cataract, left eye: Secondary | ICD-10-CM | POA: Diagnosis not present

## 2021-05-01 ENCOUNTER — Encounter (HOSPITAL_COMMUNITY)
Admission: RE | Disposition: A | Discharge status: Home / Self Care | Payer: Self-pay | Source: Home / Self Care | Attending: Ophthalmology

## 2021-05-01 ENCOUNTER — Ambulatory Visit (HOSPITAL_COMMUNITY): Payer: Medicare Other | Admitting: Anesthesiology

## 2021-05-01 ENCOUNTER — Ambulatory Visit (HOSPITAL_COMMUNITY)
Admission: RE | Admit: 2021-05-01 | Discharge: 2021-05-01 | Disposition: A | Discharge status: Home / Self Care | Payer: Medicare Other | Attending: Ophthalmology | Admitting: Ophthalmology

## 2021-05-01 DIAGNOSIS — H0100A Unspecified blepharitis right eye, upper and lower eyelids: Secondary | ICD-10-CM | POA: Insufficient documentation

## 2021-05-01 DIAGNOSIS — H02831 Dermatochalasis of right upper eyelid: Secondary | ICD-10-CM | POA: Insufficient documentation

## 2021-05-01 DIAGNOSIS — H02834 Dermatochalasis of left upper eyelid: Secondary | ICD-10-CM | POA: Diagnosis not present

## 2021-05-01 DIAGNOSIS — H5712 Ocular pain, left eye: Secondary | ICD-10-CM | POA: Diagnosis not present

## 2021-05-01 DIAGNOSIS — H43823 Vitreomacular adhesion, bilateral: Secondary | ICD-10-CM | POA: Diagnosis not present

## 2021-05-01 DIAGNOSIS — H2181 Floppy iris syndrome: Secondary | ICD-10-CM | POA: Diagnosis not present

## 2021-05-01 DIAGNOSIS — H25812 Combined forms of age-related cataract, left eye: Secondary | ICD-10-CM | POA: Insufficient documentation

## 2021-05-01 DIAGNOSIS — Z87891 Personal history of nicotine dependence: Secondary | ICD-10-CM | POA: Insufficient documentation

## 2021-05-01 DIAGNOSIS — H0100B Unspecified blepharitis left eye, upper and lower eyelids: Secondary | ICD-10-CM | POA: Diagnosis not present

## 2021-05-01 DIAGNOSIS — R0602 Shortness of breath: Secondary | ICD-10-CM | POA: Diagnosis not present

## 2021-05-01 HISTORY — PX: CATARACT EXTRACTION W/PHACO: SHX586

## 2021-05-01 SURGERY — PHACOEMULSIFICATION, CATARACT, WITH IOL INSERTION
Anesthesia: Monitor Anesthesia Care | Site: Eye | Laterality: Left

## 2021-05-01 MED ORDER — TETRACAINE HCL 0.5 % OP SOLN
1.0000 [drp] | OPHTHALMIC | Status: AC | PRN
  Administered 2021-05-01 (×3): 1 [drp] via OPHTHALMIC

## 2021-05-01 MED ORDER — SODIUM HYALURONATE 10 MG/ML IO SOLUTION
PREFILLED_SYRINGE | INTRAOCULAR | Status: DC | PRN
  Administered 2021-05-01: 0.85 mL via INTRAOCULAR

## 2021-05-01 MED ORDER — EPINEPHRINE PF 1 MG/ML IJ SOLN
INTRAMUSCULAR | Status: AC
  Filled 2021-05-01: qty 2

## 2021-05-01 MED ORDER — SODIUM HYALURONATE 23MG/ML IO SOSY
PREFILLED_SYRINGE | INTRAOCULAR | Status: DC | PRN
  Administered 2021-05-01: 0.6 mL via INTRAOCULAR

## 2021-05-01 MED ORDER — PHENYLEPHRINE HCL 2.5 % OP SOLN
1.0000 [drp] | OPHTHALMIC | Status: AC | PRN
  Administered 2021-05-01 (×3): 1 [drp] via OPHTHALMIC

## 2021-05-01 MED ORDER — PHENYLEPHRINE-KETOROLAC 1-0.3 % IO SOLN
INTRAOCULAR | Status: DC | PRN
  Administered 2021-05-01: 500 mL via OPHTHALMIC

## 2021-05-01 MED ORDER — SODIUM CHLORIDE (PF) 0.9 % IJ SOLN
INTRAMUSCULAR | Status: AC
  Filled 2021-05-01: qty 10

## 2021-05-01 MED ORDER — TROPICAMIDE 1 % OP SOLN
1.0000 [drp] | OPHTHALMIC | Status: AC | PRN
  Administered 2021-05-01 (×3): 1 [drp] via OPHTHALMIC

## 2021-05-01 MED ORDER — LIDOCAINE HCL 3.5 % OP GEL
1.0000 "application " | Freq: Once | OPHTHALMIC | Status: AC
  Administered 2021-05-01: 1 via OPHTHALMIC

## 2021-05-01 MED ORDER — LIDOCAINE HCL (PF) 1 % IJ SOLN
INTRAOCULAR | Status: DC | PRN
  Administered 2021-05-01: 1 mL via OPHTHALMIC

## 2021-05-01 MED ORDER — STERILE WATER FOR IRRIGATION IR SOLN
Status: DC | PRN
  Administered 2021-05-01: 1000 mL

## 2021-05-01 MED ORDER — BSS IO SOLN
INTRAOCULAR | Status: DC | PRN
  Administered 2021-05-01: 15 mL via INTRAOCULAR

## 2021-05-01 MED ORDER — DEXAMETHASONE 0.4 MG OP INST
VAGINAL_INSERT | OPHTHALMIC | Status: AC
  Filled 2021-05-01: qty 1

## 2021-05-01 MED ORDER — DEXAMETHASONE 0.4 MG OP INST
VAGINAL_INSERT | OPHTHALMIC | Status: DC | PRN
  Administered 2021-05-01: 0.4 mg via OPHTHALMIC

## 2021-05-01 MED ORDER — MIDAZOLAM HCL 2 MG/2ML IJ SOLN
INTRAMUSCULAR | Status: AC
  Filled 2021-05-01: qty 2

## 2021-05-01 MED ORDER — MIDAZOLAM HCL 2 MG/2ML IJ SOLN
1.0000 mg | INTRAMUSCULAR | Status: AC | PRN
Start: 2021-05-01 — End: 2021-05-01
  Administered 2021-05-01 (×2): 1 mg via INTRAVENOUS
  Filled 2021-05-01: qty 2

## 2021-05-01 MED ORDER — POVIDONE-IODINE 5 % OP SOLN
OPHTHALMIC | Status: DC | PRN
  Administered 2021-05-01: 1 via OPHTHALMIC

## 2021-05-01 SURGICAL SUPPLY — 12 items
CLOTH BEACON ORANGE TIMEOUT ST (SAFETY) ×2 IMPLANT
EYE SHIELD UNIVERSAL CLEAR (GAUZE/BANDAGES/DRESSINGS) ×2 IMPLANT
GLOVE SURG UNDER POLY LF SZ6.5 (GLOVE) ×2 IMPLANT
GLOVE SURG UNDER POLY LF SZ7 (GLOVE) ×2 IMPLANT
NEEDLE HYPO 18GX1.5 BLUNT FILL (NEEDLE) ×2 IMPLANT
PAD ARMBOARD 7.5X6 YLW CONV (MISCELLANEOUS) ×2 IMPLANT
RAYONE EMV US (Intraocular Lens) ×2 IMPLANT
RING MALYGIN 7.0 (MISCELLANEOUS) IMPLANT
SYR TB 1ML LL NO SAFETY (SYRINGE) ×2 IMPLANT
TAPE SURG TRANSPORE 1 IN (GAUZE/BANDAGES/DRESSINGS) ×1 IMPLANT
TAPE SURGICAL TRANSPORE 1 IN (GAUZE/BANDAGES/DRESSINGS) ×1
WATER STERILE IRR 250ML POUR (IV SOLUTION) ×2 IMPLANT

## 2021-05-01 NOTE — Anesthesia Procedure Notes (Signed)
Procedure Name: MAC Date/Time: 05/01/2021 10:01 AM Performed by: Orlie Dakin, CRNA Pre-anesthesia Checklist: Patient identified, Emergency Drugs available, Suction available and Patient being monitored Patient Re-evaluated:Patient Re-evaluated prior to induction Oxygen Delivery Method: Nasal cannula Placement Confirmation: positive ETCO2

## 2021-05-01 NOTE — Transfer of Care (Signed)
Immediate Anesthesia Transfer of Care Note  Patient: Scott Owens  Procedure(s) Performed: CATARACT EXTRACTION PHACO AND INTRAOCULAR LENS PLACEMENT (IOC) WITH PLACEMENT OF CORTICOSTEROID (Left: Eye)  Patient Location: Short Stay  Anesthesia Type:MAC  Level of Consciousness: awake, alert  and oriented  Airway & Oxygen Therapy: Patient Spontanous Breathing  Post-op Assessment: Report given to RN and Post -op Vital signs reviewed and stable  Post vital signs: Reviewed and stable  Last Vitals:  Vitals Value Taken Time  BP    Temp    Pulse    Resp    SpO2      Last Pain:  Vitals:   05/01/21 0930  TempSrc: Oral  PainSc: 0-No pain      Patients Stated Pain Goal: 5 (81/84/03 7543)  Complications: No notable events documented.

## 2021-05-01 NOTE — Discharge Instructions (Signed)
Please discharge patient when stable, will follow up today with Dr. Rafeef Lau at the [facility] [place] office immediately following discharge.  Leave shield in place until visit.  All paperwork with discharge instructions will be given at the office.  [facility] Address:  [address]  [place], [place] [address]  

## 2021-05-01 NOTE — Op Note (Signed)
Date of procedure: 05/01/21  Pre-operative diagnosis: Visually significant combined age-related cataract, Left Eye; Poor dilation, Left eye (H25.812; H21.81)  Post-operative diagnosis:  Visually significant cataract, Left Eye Intra-operative Floppy Iris Syndrome, Left Eye Pain and inflammation after cataract surgery, left eye  Procedure:  Complex removal of cataract via phacoemulsification and insertion of intra-ocular lens Rayner [id number] +20.5D into the capsular bag of the Left Eye (CPT [phone]) Placement of Dextenza implant, left eye  Attending surgeon: Gerda Diss. Roselyn Doby, MD, MA  Anesthesia: MAC, Topical Akten, Omidria  Complications: None  Estimated Blood Loss: <64m (minimal)  Specimens: None  Implants: As above  Indications:  Visually significant cataract, Left Eye  Procedure:  The patient was seen and identified in the pre-operative area. The operative eye was identified and dilated.  The operative eye was marked.  Topical anesthesia was administered to the operative eye.     The patient was then to the operative suite and placed in the supine position.  A timeout was performed confirming the patient, procedure to be performed, and all other relevant information.   The patient's face was prepped and draped in the usual fashion for intra-ocular surgery.  A lid speculum was placed into the operative eye and the surgical microscope moved into place and focused.  Poor dilation of the iris was confirmed.  An inferotemporal paracentesis was created using a 20 gauge paracentesis blade.  Shugarcaine was injected into the anterior chamber.  Viscoelastic was injected into the anterior chamber.  A temporal clear-corneal main wound incision was created using a 2.460mmicrokeratome.  A Malyugin ring was placed.  A continuous curvilinear capsulorrhexis was initiated using an irrigating cystitome and completed using capsulorrhexis forceps.  Hydrodissection and hydrodeliniation were performed.   Viscoelastic was injected into the anterior chamber.  A phacoemulsification handpiece and a chopper as a second instrument were used to remove the nucleus and epinucleus. The irrigation/aspiration handpiece was used to remove any remaining cortical material.   The capsular bag was reinflated with viscoelastic, checked, and found to be intact.  The intraocular lens was inserted into the capsular bag and dialed into place using a MaSurveyor, mineralsThe Malyugin ring was removed.  The irrigation/aspiration handpiece was used to remove any remaining viscoelastic.  The clear corneal wound and paracentesis wounds were then hydrated and checked with Weck-Cels to be watertight. The lid speculum was removed.  The lower punctum was dilated and filled with Provisc. A Dextenza implant was placed in the upper canaliculus without complication.  The drape was removed, and the patient's face was cleaned with a wet and dry 4x4.  A clear shield was taped over the eye. The patient was taken to the post-operative care unit in good condition, having tolerated the procedure well.  Post-Op Instructions: [facility] and will receive all other orders and instructions.

## 2021-05-01 NOTE — Anesthesia Preprocedure Evaluation (Signed)
Anesthesia Evaluation  Patient identified by MRN, date of birth, ID band Patient awake    Reviewed: Allergy & Precautions, NPO status , Patient's Chart, lab work & pertinent test results, reviewed documented beta blocker date and time   Airway Mallampati: III  TM Distance: >3 FB Neck ROM: Full    Dental  (+) Dental Advisory Given, Upper Dentures   Pulmonary shortness of breath and with exertion, former smoker,    Pulmonary exam normal breath sounds clear to auscultation       Cardiovascular hypertension, Pt. on medications and Pt. on home beta blockers Normal cardiovascular exam Rhythm:Regular Rate:Normal     Neuro/Psych  Headaches, Anxiety  Neuromuscular disease (left facial spasm)    GI/Hepatic hiatal hernia, GERD  Medicated,  Endo/Other  negative endocrine ROS  Renal/GU negative Renal ROS     Musculoskeletal negative musculoskeletal ROS (+)   Abdominal   Peds  Hematology negative hematology ROS (+)   Anesthesia Other Findings   Reproductive/Obstetrics negative OB ROS                             Anesthesia Physical Anesthesia Plan  ASA: 3  Anesthesia Plan: MAC   Post-op Pain Management:    Induction:   PONV Risk Score and Plan:   Airway Management Planned: Nasal Cannula and Natural Airway  Additional Equipment:   Intra-op Plan:   Post-operative Plan:   Informed Consent: I have reviewed the patients History and Physical, chart, labs and discussed the procedure including the risks, benefits and alternatives for the proposed anesthesia with the patient or authorized representative who has indicated his/her understanding and acceptance.       Plan Discussed with: CRNA and Surgeon  Anesthesia Plan Comments:         Anesthesia Quick Evaluation

## 2021-05-01 NOTE — Anesthesia Postprocedure Evaluation (Signed)
Anesthesia Post Note  Patient: SAMBA CUMBA  Procedure(s) Performed: CATARACT EXTRACTION PHACO AND INTRAOCULAR LENS PLACEMENT (IOC) WITH PLACEMENT OF CORTICOSTEROID (Left: Eye)  Patient location during evaluation: PACU Anesthesia Type: MAC Level of consciousness: awake and alert and oriented Pain management: pain level controlled Vital Signs Assessment: post-procedure vital signs reviewed and stable Respiratory status: spontaneous breathing, nonlabored ventilation and respiratory function stable Cardiovascular status: stable and blood pressure returned to baseline Postop Assessment: no apparent nausea or vomiting Anesthetic complications: no   No notable events documented.   Last Vitals:  Vitals:   05/01/21 0939 05/01/21 1025  BP:  (!) 168/78  Pulse: (!) 58 64  Resp: 15 18  Temp:  36.6 C  SpO2: 99% 97%    Last Pain:  Vitals:   05/01/21 1025  TempSrc: Axillary  PainSc: 0-No pain                 Kayzlee Wirtanen C Mars Scheaffer

## 2021-05-01 NOTE — Interval H&P Note (Signed)
History and Physical Interval Note:  05/01/2021 9:50 AM  Scott Owens  has presented today for surgery, with the diagnosis of nuclear cataract left eye.  The various methods of treatment have been discussed with the patient and family. After consideration of risks, benefits and other options for treatment, the patient has consented to  Procedure(s) with comments: CATARACT EXTRACTION PHACO AND INTRAOCULAR LENS PLACEMENT (IOC) WITH PLACEMENT OF CORTICOSTEROID (Left) - left as a surgical intervention.  The patient's history has been reviewed, patient examined, no change in status, stable for surgery.  I have reviewed the patient's chart and labs.  Questions were answered to the patient's satisfaction.     Baruch Goldmann

## 2021-05-05 ENCOUNTER — Encounter (HOSPITAL_COMMUNITY): Payer: Self-pay | Admitting: Ophthalmology

## 2021-05-22 DIAGNOSIS — H25811 Combined forms of age-related cataract, right eye: Secondary | ICD-10-CM | POA: Diagnosis not present

## 2021-05-23 NOTE — H&P (Signed)
Surgical History & Physical  Patient Name: Scott Owens DOB: 1941-05-29  Surgery: Cataract extraction with intraocular lens implant phacoemulsification; Right Eye  Surgeon: Baruch Goldmann MD Surgery Date:  05-29-21 Pre-Op Date:  05-12-21  HPI: A 57 Yr. old male patient 1. The patient is returning after cataract post-op. The left eye is affected. Status post cataract post-op, which began 1 weeks ago: Since the last visit, the affected area has no changes noted. The patient states that the vision is better than it was before surgery, but not as clear as he was expecting it to be at this point. The patient's vision is blurry. Patient is following medication instructions, the patient is using the post op drop BID OS. The patient experiences no eye pain and no flashes, floater, shadow, curtain or veil. The patient reports a little pain the first couple of days, and consistent watering since surgery. The patient presents for pre op OD as well, and states the vision in that eye is negatively affecting his quality of life, and making it difficult when trying to do any kind of daily activities. The patient plays games on the ipad, and watches the television, but his vision is so blurry it makes it less enjoyable. HPI was performed by Baruch Goldmann .  Medical History: Cataracts High Blood Pressure Hypertension, Migraine, Hemifacial spasm of left f...  Review of Systems Negative Allergic/Immunologic Negative Cardiovascular Negative Constitutional Negative Ear, Nose, Mouth & Throat Negative Endocrine Negative Eyes Negative Gastrointestinal Negative Genitourinary Negative Hemotologic/Lymphatic Negative Integumentary Negative Musculoskeletal Negative Neurological Negative Psychiatry Negative Respiratory  Social   Former smoker   Medication Prednisolone-Moxifloxacin-Bromfenac, Cholesterol, Flomax, Valium, Tamsulosin, Meclizine, Metoprolol, Dualpro EX,   Sx/Procedures Phaco c IOL OS,  Gallbladder, Botox shots left face for facial tick,   Drug Allergies  Sulfa drugs,   History & Physical: Heent: Cataract, Right Eye NECK: supple without bruits LUNGS: lungs clear to auscultation CV: regular rate and rhythm Abdomen: soft and non-tender  Impression & Plan: Assessment: 1.  CATARACT EXTRACTION STATUS; Left Eye (Z98.42) 2.  INTRAOCULAR LENS IOL (Z96.1) 3.  COMBINED FORMS AGE RELATED CATARACT; Both Eyes (H25.813)  Plan: 1.  1 week after cataract surgery. Doing well with improved vision and normal eye pressure. Call with any problems or concerns. Continue Pred-Moxi-Brom 2x/day for 3 more weeks.  2.  Doing well since surgery Continue Post-op medications  3.  Cataract accounts for the patient's decreased vision. This visual impairment is not correctable with a tolerable change in glasses or contact lenses. Cataract surgery with an implantation of a new lens should significantly improve the visual and functional status of the patient. Discussed all risks, benefits, alternatives, and potential complications. Discussed the procedures and recovery. Patient desires to have surgery. A-scan ordered and performed today for intra-ocular lens calculations. The surgery will be performed in order to improve vision for driving, reading, and for eye examinations. Recommend phacoemulsification with intra-ocular lens. Recommend Dextenza for post-operative pain and inflammation. Right Eye. Dilates poorly - shugacaine by protocol. Malyugin Ring. Omidira. Flomax. Surgery required to correct imbalance of vision.

## 2021-05-26 ENCOUNTER — Other Ambulatory Visit: Payer: Self-pay

## 2021-05-26 ENCOUNTER — Encounter (HOSPITAL_COMMUNITY)
Admission: RE | Admit: 2021-05-26 | Discharge: 2021-05-26 | Disposition: A | Discharge status: Home / Self Care | Payer: Medicare Other | Source: Ambulatory Visit | Attending: Ophthalmology | Admitting: Ophthalmology

## 2021-05-29 ENCOUNTER — Ambulatory Visit (HOSPITAL_COMMUNITY)
Admission: RE | Admit: 2021-05-29 | Discharge: 2021-05-29 | Disposition: A | Discharge status: Home / Self Care | Payer: Medicare Other | Attending: Ophthalmology | Admitting: Ophthalmology

## 2021-05-29 ENCOUNTER — Encounter (HOSPITAL_COMMUNITY)
Admission: RE | Disposition: A | Discharge status: Home / Self Care | Payer: Self-pay | Source: Home / Self Care | Attending: Ophthalmology

## 2021-05-29 ENCOUNTER — Ambulatory Visit (HOSPITAL_COMMUNITY): Payer: Medicare Other | Admitting: Certified Registered"

## 2021-05-29 ENCOUNTER — Encounter (HOSPITAL_COMMUNITY): Payer: Self-pay | Admitting: Ophthalmology

## 2021-05-29 DIAGNOSIS — H5711 Ocular pain, right eye: Secondary | ICD-10-CM | POA: Diagnosis not present

## 2021-05-29 DIAGNOSIS — K219 Gastro-esophageal reflux disease without esophagitis: Secondary | ICD-10-CM | POA: Insufficient documentation

## 2021-05-29 DIAGNOSIS — H25811 Combined forms of age-related cataract, right eye: Secondary | ICD-10-CM | POA: Diagnosis not present

## 2021-05-29 DIAGNOSIS — Z79899 Other long term (current) drug therapy: Secondary | ICD-10-CM | POA: Diagnosis not present

## 2021-05-29 DIAGNOSIS — R0602 Shortness of breath: Secondary | ICD-10-CM | POA: Diagnosis not present

## 2021-05-29 DIAGNOSIS — Z9842 Cataract extraction status, left eye: Secondary | ICD-10-CM | POA: Insufficient documentation

## 2021-05-29 DIAGNOSIS — Z87891 Personal history of nicotine dependence: Secondary | ICD-10-CM | POA: Diagnosis not present

## 2021-05-29 DIAGNOSIS — I10 Essential (primary) hypertension: Secondary | ICD-10-CM | POA: Diagnosis not present

## 2021-05-29 DIAGNOSIS — Z961 Presence of intraocular lens: Secondary | ICD-10-CM | POA: Diagnosis not present

## 2021-05-29 HISTORY — PX: CATARACT EXTRACTION W/PHACO: SHX586

## 2021-05-29 SURGERY — PHACOEMULSIFICATION, CATARACT, WITH IOL INSERTION
Anesthesia: Monitor Anesthesia Care | Site: Eye | Laterality: Right

## 2021-05-29 MED ORDER — STERILE WATER FOR IRRIGATION IR SOLN
Status: DC | PRN
  Administered 2021-05-29: 250 mL

## 2021-05-29 MED ORDER — POVIDONE-IODINE 5 % OP SOLN
OPHTHALMIC | Status: DC | PRN
  Administered 2021-05-29: 1 via OPHTHALMIC

## 2021-05-29 MED ORDER — LIDOCAINE HCL 3.5 % OP GEL
1.0000 "application " | Freq: Once | OPHTHALMIC | Status: AC
  Administered 2021-05-29: 1 via OPHTHALMIC

## 2021-05-29 MED ORDER — DEXAMETHASONE 0.4 MG OP INST
VAGINAL_INSERT | OPHTHALMIC | Status: DC | PRN
  Administered 2021-05-29: 0.4 mg via OPHTHALMIC

## 2021-05-29 MED ORDER — TETRACAINE HCL 0.5 % OP SOLN
1.0000 [drp] | OPHTHALMIC | Status: AC | PRN
  Administered 2021-05-29 (×3): 1 [drp] via OPHTHALMIC

## 2021-05-29 MED ORDER — LACTATED RINGERS IV SOLN: INTRAVENOUS | Status: DC

## 2021-05-29 MED ORDER — EPINEPHRINE PF 1 MG/ML IJ SOLN
INTRAOCULAR | Status: DC | PRN
  Administered 2021-05-29: 08:00:00 500 mL

## 2021-05-29 MED ORDER — BSS IO SOLN
INTRAOCULAR | Status: DC | PRN
  Administered 2021-05-29: 15 mL via INTRAOCULAR

## 2021-05-29 MED ORDER — TROPICAMIDE 1 % OP SOLN
1.0000 [drp] | OPHTHALMIC | Status: AC | PRN
  Administered 2021-05-29 (×3): 1 [drp] via OPHTHALMIC
  Filled 2021-05-29: qty 2

## 2021-05-29 MED ORDER — SODIUM HYALURONATE 10 MG/ML IO SOLUTION
PREFILLED_SYRINGE | INTRAOCULAR | Status: DC | PRN
  Administered 2021-05-29: 0.85 mL via INTRAOCULAR

## 2021-05-29 MED ORDER — LIDOCAINE HCL (PF) 1 % IJ SOLN
INTRAOCULAR | Status: DC | PRN
  Administered 2021-05-29: 08:00:00 1 mL via OPHTHALMIC

## 2021-05-29 MED ORDER — EPINEPHRINE PF 1 MG/ML IJ SOLN
INTRAMUSCULAR | Status: AC
  Filled 2021-05-29: qty 2

## 2021-05-29 MED ORDER — SODIUM HYALURONATE 23MG/ML IO SOSY
PREFILLED_SYRINGE | INTRAOCULAR | Status: DC | PRN
  Administered 2021-05-29: 0.6 mL via INTRAOCULAR

## 2021-05-29 MED ORDER — PHENYLEPHRINE HCL 2.5 % OP SOLN
1.0000 [drp] | OPHTHALMIC | Status: AC | PRN
  Administered 2021-05-29 (×3): 1 [drp] via OPHTHALMIC

## 2021-05-29 MED ORDER — DEXAMETHASONE 0.4 MG OP INST
VAGINAL_INSERT | OPHTHALMIC | Status: AC
  Filled 2021-05-29: qty 1

## 2021-05-29 MED ORDER — MIDAZOLAM HCL 2 MG/2ML IJ SOLN
INTRAMUSCULAR | Status: AC
  Filled 2021-05-29: qty 2

## 2021-05-29 SURGICAL SUPPLY — 10 items
CLOTH BEACON ORANGE TIMEOUT ST (SAFETY) ×2 IMPLANT
EYE SHIELD UNIVERSAL CLEAR (GAUZE/BANDAGES/DRESSINGS) ×2 IMPLANT
GLOVE SURG UNDER POLY LF SZ7 (GLOVE) ×4 IMPLANT
NEEDLE HYPO 18GX1.5 BLUNT FILL (NEEDLE) ×2 IMPLANT
PAD ARMBOARD 7.5X6 YLW CONV (MISCELLANEOUS) ×2 IMPLANT
RayOne EMV US (Intraocular Lens) ×2 IMPLANT
SYR TB 1ML LL NO SAFETY (SYRINGE) ×2 IMPLANT
TAPE SURG TRANSPORE 1 IN (GAUZE/BANDAGES/DRESSINGS) ×1 IMPLANT
TAPE SURGICAL TRANSPORE 1 IN (GAUZE/BANDAGES/DRESSINGS) ×2
WATER STERILE IRR 250ML POUR (IV SOLUTION) ×2 IMPLANT

## 2021-05-29 NOTE — Interval H&P Note (Signed)
History and Physical Interval Note:  05/29/2021 7:53 AM  [place]  has presented today for surgery, with the diagnosis of nuclear cataract right eye.  The various methods of treatment have been discussed with the patient and family. After consideration of risks, benefits and other options for treatment, the patient has consented to  Procedure(s) with comments: CATARACT EXTRACTION PHACO AND INTRAOCULAR LENS PLACEMENT RIGHT EYE (Right) - right as a surgical intervention.  The patient's history has been reviewed, patient examined, no change in status, stable for surgery.  I have reviewed the patient's chart and labs.  Questions were answered to the patient's satisfaction.     Baruch Goldmann

## 2021-05-29 NOTE — Transfer of Care (Signed)
Immediate Anesthesia Transfer of Care Note  Patient: Scott Owens  Procedure(s) Performed: CATARACT EXTRACTION PHACO AND INTRAOCULAR LENS PLACEMENT RIGHT EYE (Right: Eye)  Patient Location: Short Stay  Anesthesia Type:MAC  Level of Consciousness: awake, alert  and oriented  Airway & Oxygen Therapy: Patient Spontanous Breathing  Post-op Assessment: Report given to RN and Post -op Vital signs reviewed and stable  Post vital signs: Reviewed and stable  Last Vitals:  Vitals Value Taken Time  BP    Temp    Pulse    Resp    SpO2      Last Pain:  Vitals:   05/29/21 0702  TempSrc: Oral  PainSc: 0-No pain         Complications: No notable events documented.

## 2021-05-29 NOTE — Op Note (Signed)
Date of procedure: 05/29/21  Pre-operative diagnosis:  Visually significant combined form age-related cataract, Right Eye (H25.811)  Post-operative diagnosis:   1. Visually significant combined form age-related cataract, Right Eye (H25.811) 2. Pain and inflammation following cataract surgery Right Eye (H57.11)  Procedure:  Removal of cataract via phacoemulsification and insertion of intra-ocular lens Rayner [id number] +20.5D into the capsular bag of the Right Eye 2. Placement of Dextenza insert, Right Eye  Attending surgeon: Gerda Diss. Tascha Casares, MD, MA  Anesthesia: MAC, Topical Akten  Complications: None  Estimated Blood Loss: <24m (minimal)  Specimens: None  Implants: As above  Indications:  Visually significant age-related cataract, Right Eye  Procedure:  The patient was seen and identified in the pre-operative area. The operative eye was identified and dilated.  The operative eye was marked.  Topical anesthesia was administered to the operative eye.     The patient was then to the operative suite and placed in the supine position.  A timeout was performed confirming the patient, procedure to be performed, and all other relevant information.   The patient's face was prepped and draped in the usual fashion for intra-ocular surgery.  A lid speculum was placed into the operative eye and the surgical microscope moved into place and focused.  A superotemporal paracentesis was created using a 20 gauge paracentesis blade.  Shugarcaine was injected into the anterior chamber.  Viscoelastic was injected into the anterior chamber.  A temporal clear-corneal main wound incision was created using a 2.432mmicrokeratome.  A continuous curvilinear capsulorrhexis was initiated using an irrigating cystitome and completed using capsulorrhexis forceps.  Hydrodissection and hydrodeliniation were performed.  Viscoelastic was injected into the anterior chamber.  A phacoemulsification handpiece and a chopper as a  second instrument were used to remove the nucleus and epinucleus. The irrigation/aspiration handpiece was used to remove any remaining cortical material.   The capsular bag was reinflated with viscoelastic, checked, and found to be intact.  The intraocular lens was inserted into the capsular bag.  The irrigation/aspiration handpiece was used to remove any remaining viscoelastic.  The clear corneal wound and paracentesis wounds were then hydrated and checked with Weck-Cels to be watertight.    The lid-speculum was removed. The lower punctum was dilated. A Dextenza implant was placed in the lower canaliculus without complication.  The drape was removed.  The patient's face was cleaned with a wet and dry 4x4. A clear shield was taped over the eye. The patient was taken to the post-operative care unit in good condition, having tolerated the procedure well.  Post-Op Instructions: The patient will follow up at [facility] a same day post-operative evaluation and will receive all other orders and instructions.

## 2021-05-29 NOTE — Discharge Instructions (Signed)
Please discharge patient when stable, will follow up today with Dr. Malanie Koloski at the [facility] office immediately following discharge.  Leave shield in place until visit.  All paperwork with discharge instructions will be given at the office.  [facility] Address:  [address]  [place], [place] [address]  

## 2021-05-29 NOTE — Anesthesia Preprocedure Evaluation (Signed)
Anesthesia Evaluation  Patient identified by MRN, date of birth, ID band Patient awake    Reviewed: Allergy & Precautions, NPO status , Patient's Chart, lab work & pertinent test results, reviewed documented beta blocker date and time   Airway Mallampati: III  TM Distance: >3 FB Neck ROM: Full    Dental  (+) Dental Advisory Given, Upper Dentures   Pulmonary shortness of breath and with exertion, former smoker,    Pulmonary exam normal breath sounds clear to auscultation       Cardiovascular hypertension, Pt. on medications and Pt. on home beta blockers Normal cardiovascular exam Rhythm:Regular Rate:Normal     Neuro/Psych  Headaches, Anxiety  Neuromuscular disease (left facial spasm)    GI/Hepatic hiatal hernia, GERD  Medicated,  Endo/Other  negative endocrine ROS  Renal/GU negative Renal ROS     Musculoskeletal negative musculoskeletal ROS (+)   Abdominal   Peds  Hematology negative hematology ROS (+)   Anesthesia Other Findings   Reproductive/Obstetrics negative OB ROS                             Anesthesia Physical  Anesthesia Plan  ASA: 3  Anesthesia Plan: MAC   Post-op Pain Management:    Induction:   PONV Risk Score and Plan:   Airway Management Planned: Nasal Cannula and Natural Airway  Additional Equipment:   Intra-op Plan:   Post-operative Plan:   Informed Consent: I have reviewed the patients History and Physical, chart, labs and discussed the procedure including the risks, benefits and alternatives for the proposed anesthesia with the patient or authorized representative who has indicated his/her understanding and acceptance.       Plan Discussed with: CRNA and Surgeon  Anesthesia Plan Comments:         Anesthesia Quick Evaluation

## 2021-05-29 NOTE — Anesthesia Procedure Notes (Signed)
Procedure Name: MAC Date/Time: 05/29/2021 8:01 AM Performed by: Orlie Dakin, CRNA Pre-anesthesia Checklist: Patient identified, Emergency Drugs available, Suction available and Patient being monitored Patient Re-evaluated:Patient Re-evaluated prior to induction Oxygen Delivery Method: Nasal cannula Placement Confirmation: positive ETCO2

## 2021-05-29 NOTE — Anesthesia Postprocedure Evaluation (Signed)
Anesthesia Post Note  Patient: AVIV LENGACHER  Procedure(s) Performed: CATARACT EXTRACTION PHACO AND INTRAOCULAR LENS PLACEMENT RIGHT EYE (Right: Eye)  Patient location during evaluation: Phase II Anesthesia Type: MAC Level of consciousness: awake Pain management: pain level controlled Vital Signs Assessment: post-procedure vital signs reviewed and stable Respiratory status: spontaneous breathing and respiratory function stable Cardiovascular status: blood pressure returned to baseline and stable Postop Assessment: no headache and no apparent nausea or vomiting Anesthetic complications: no Comments: Late entry   No notable events documented.   Last Vitals:  Vitals:   05/29/21 0702 05/29/21 0820  BP: (!) 157/82 (!) 144/69  Pulse: 64 62  Resp: (!) 24 20  Temp: 36.6 C 36.6 C  SpO2: 96% 97%    Last Pain:  Vitals:   05/29/21 0820  TempSrc: Oral  PainSc: 0-No pain                 Louann Sjogren

## 2021-05-30 ENCOUNTER — Encounter (HOSPITAL_COMMUNITY): Payer: Self-pay | Admitting: Ophthalmology

## 2021-06-09 ENCOUNTER — Encounter (HOSPITAL_COMMUNITY): Payer: Self-pay | Admitting: Ophthalmology

## 2021-06-17 DIAGNOSIS — G245 Blepharospasm: Secondary | ICD-10-CM | POA: Diagnosis not present

## 2021-06-17 DIAGNOSIS — G5132 Clonic hemifacial spasm, left: Secondary | ICD-10-CM | POA: Diagnosis not present

## 2021-07-01 DIAGNOSIS — Z23 Encounter for immunization: Secondary | ICD-10-CM | POA: Diagnosis not present

## 2021-07-11 DIAGNOSIS — Z20828 Contact with and (suspected) exposure to other viral communicable diseases: Secondary | ICD-10-CM | POA: Diagnosis not present

## 2021-09-12 DIAGNOSIS — Z20828 Contact with and (suspected) exposure to other viral communicable diseases: Secondary | ICD-10-CM | POA: Diagnosis not present

## 2021-10-21 DIAGNOSIS — G245 Blepharospasm: Secondary | ICD-10-CM | POA: Diagnosis not present

## 2021-10-21 DIAGNOSIS — G5132 Clonic hemifacial spasm, left: Secondary | ICD-10-CM | POA: Diagnosis not present

## 2021-11-24 DIAGNOSIS — Z0000 Encounter for general adult medical examination without abnormal findings: Secondary | ICD-10-CM | POA: Diagnosis not present

## 2021-11-24 DIAGNOSIS — Z125 Encounter for screening for malignant neoplasm of prostate: Secondary | ICD-10-CM | POA: Diagnosis not present

## 2021-11-24 DIAGNOSIS — E7800 Pure hypercholesterolemia, unspecified: Secondary | ICD-10-CM | POA: Diagnosis not present

## 2021-11-24 DIAGNOSIS — Z1339 Encounter for screening examination for other mental health and behavioral disorders: Secondary | ICD-10-CM | POA: Diagnosis not present

## 2021-11-24 DIAGNOSIS — Z299 Encounter for prophylactic measures, unspecified: Secondary | ICD-10-CM | POA: Diagnosis not present

## 2021-11-24 DIAGNOSIS — Z79899 Other long term (current) drug therapy: Secondary | ICD-10-CM | POA: Diagnosis not present

## 2021-11-24 DIAGNOSIS — R5383 Other fatigue: Secondary | ICD-10-CM | POA: Diagnosis not present

## 2021-11-24 DIAGNOSIS — I10 Essential (primary) hypertension: Secondary | ICD-10-CM | POA: Diagnosis not present

## 2021-11-24 DIAGNOSIS — Z7189 Other specified counseling: Secondary | ICD-10-CM | POA: Diagnosis not present

## 2021-11-24 DIAGNOSIS — Z6832 Body mass index (BMI) 32.0-32.9, adult: Secondary | ICD-10-CM | POA: Diagnosis not present

## 2021-11-24 DIAGNOSIS — Z1331 Encounter for screening for depression: Secondary | ICD-10-CM | POA: Diagnosis not present

## 2021-12-10 DIAGNOSIS — Z299 Encounter for prophylactic measures, unspecified: Secondary | ICD-10-CM | POA: Diagnosis not present

## 2021-12-10 DIAGNOSIS — Z6832 Body mass index (BMI) 32.0-32.9, adult: Secondary | ICD-10-CM | POA: Diagnosis not present

## 2021-12-10 DIAGNOSIS — I10 Essential (primary) hypertension: Secondary | ICD-10-CM | POA: Diagnosis not present

## 2021-12-10 DIAGNOSIS — L723 Sebaceous cyst: Secondary | ICD-10-CM | POA: Diagnosis not present

## 2021-12-10 DIAGNOSIS — Z87891 Personal history of nicotine dependence: Secondary | ICD-10-CM | POA: Diagnosis not present

## 2021-12-22 DIAGNOSIS — I10 Essential (primary) hypertension: Secondary | ICD-10-CM | POA: Diagnosis not present

## 2021-12-22 DIAGNOSIS — L989 Disorder of the skin and subcutaneous tissue, unspecified: Secondary | ICD-10-CM | POA: Diagnosis not present

## 2021-12-22 DIAGNOSIS — Z6832 Body mass index (BMI) 32.0-32.9, adult: Secondary | ICD-10-CM | POA: Diagnosis not present

## 2021-12-22 DIAGNOSIS — Z299 Encounter for prophylactic measures, unspecified: Secondary | ICD-10-CM | POA: Diagnosis not present

## 2021-12-22 DIAGNOSIS — Z713 Dietary counseling and surveillance: Secondary | ICD-10-CM | POA: Diagnosis not present

## 2022-01-06 DIAGNOSIS — L989 Disorder of the skin and subcutaneous tissue, unspecified: Secondary | ICD-10-CM | POA: Diagnosis not present

## 2022-02-10 DIAGNOSIS — G245 Blepharospasm: Secondary | ICD-10-CM | POA: Diagnosis not present

## 2022-02-10 DIAGNOSIS — G5132 Clonic hemifacial spasm, left: Secondary | ICD-10-CM | POA: Diagnosis not present

## 2022-02-11 DIAGNOSIS — L218 Other seborrheic dermatitis: Secondary | ICD-10-CM | POA: Diagnosis not present

## 2022-02-11 DIAGNOSIS — L720 Epidermal cyst: Secondary | ICD-10-CM | POA: Diagnosis not present

## 2022-02-11 DIAGNOSIS — D0362 Melanoma in situ of left upper limb, including shoulder: Secondary | ICD-10-CM | POA: Diagnosis not present

## 2022-02-23 DIAGNOSIS — L988 Other specified disorders of the skin and subcutaneous tissue: Secondary | ICD-10-CM | POA: Diagnosis not present

## 2022-02-23 DIAGNOSIS — D0362 Melanoma in situ of left upper limb, including shoulder: Secondary | ICD-10-CM | POA: Diagnosis not present

## 2022-03-30 DIAGNOSIS — L928 Other granulomatous disorders of the skin and subcutaneous tissue: Secondary | ICD-10-CM | POA: Diagnosis not present

## 2022-04-06 DIAGNOSIS — R059 Cough, unspecified: Secondary | ICD-10-CM | POA: Diagnosis not present

## 2022-04-06 DIAGNOSIS — C4360 Malignant melanoma of unspecified upper limb, including shoulder: Secondary | ICD-10-CM | POA: Diagnosis not present

## 2022-04-06 DIAGNOSIS — I10 Essential (primary) hypertension: Secondary | ICD-10-CM | POA: Diagnosis not present

## 2022-04-06 DIAGNOSIS — Z299 Encounter for prophylactic measures, unspecified: Secondary | ICD-10-CM | POA: Diagnosis not present

## 2022-04-06 DIAGNOSIS — J069 Acute upper respiratory infection, unspecified: Secondary | ICD-10-CM | POA: Diagnosis not present

## 2022-04-28 DIAGNOSIS — Z23 Encounter for immunization: Secondary | ICD-10-CM | POA: Diagnosis not present

## 2022-05-28 DIAGNOSIS — H9319 Tinnitus, unspecified ear: Secondary | ICD-10-CM | POA: Diagnosis not present

## 2022-05-28 DIAGNOSIS — I10 Essential (primary) hypertension: Secondary | ICD-10-CM | POA: Diagnosis not present

## 2022-05-28 DIAGNOSIS — Z299 Encounter for prophylactic measures, unspecified: Secondary | ICD-10-CM | POA: Diagnosis not present

## 2022-05-28 DIAGNOSIS — Z6832 Body mass index (BMI) 32.0-32.9, adult: Secondary | ICD-10-CM | POA: Diagnosis not present

## 2022-05-28 DIAGNOSIS — G2581 Restless legs syndrome: Secondary | ICD-10-CM | POA: Diagnosis not present

## 2022-05-28 DIAGNOSIS — R972 Elevated prostate specific antigen [PSA]: Secondary | ICD-10-CM | POA: Diagnosis not present

## 2022-11-25 DIAGNOSIS — Z1331 Encounter for screening for depression: Secondary | ICD-10-CM | POA: Diagnosis not present

## 2022-11-25 DIAGNOSIS — Z299 Encounter for prophylactic measures, unspecified: Secondary | ICD-10-CM | POA: Diagnosis not present

## 2022-11-25 DIAGNOSIS — Z1339 Encounter for screening examination for other mental health and behavioral disorders: Secondary | ICD-10-CM | POA: Diagnosis not present

## 2022-11-25 DIAGNOSIS — Z79899 Other long term (current) drug therapy: Secondary | ICD-10-CM | POA: Diagnosis not present

## 2022-11-25 DIAGNOSIS — Z0000 Encounter for general adult medical examination without abnormal findings: Secondary | ICD-10-CM | POA: Diagnosis not present

## 2022-11-25 DIAGNOSIS — Z7189 Other specified counseling: Secondary | ICD-10-CM | POA: Diagnosis not present

## 2022-11-25 DIAGNOSIS — Z125 Encounter for screening for malignant neoplasm of prostate: Secondary | ICD-10-CM | POA: Diagnosis not present

## 2022-11-25 DIAGNOSIS — E7800 Pure hypercholesterolemia, unspecified: Secondary | ICD-10-CM | POA: Diagnosis not present

## 2022-11-25 DIAGNOSIS — R5383 Other fatigue: Secondary | ICD-10-CM | POA: Diagnosis not present

## 2023-05-28 DIAGNOSIS — B359 Dermatophytosis, unspecified: Secondary | ICD-10-CM | POA: Diagnosis not present

## 2023-05-28 DIAGNOSIS — L309 Dermatitis, unspecified: Secondary | ICD-10-CM | POA: Diagnosis not present

## 2023-05-28 DIAGNOSIS — F32A Depression, unspecified: Secondary | ICD-10-CM | POA: Diagnosis not present

## 2023-05-28 DIAGNOSIS — Z299 Encounter for prophylactic measures, unspecified: Secondary | ICD-10-CM | POA: Diagnosis not present

## 2023-05-28 DIAGNOSIS — I739 Peripheral vascular disease, unspecified: Secondary | ICD-10-CM | POA: Diagnosis not present

## 2023-05-28 DIAGNOSIS — I10 Essential (primary) hypertension: Secondary | ICD-10-CM | POA: Diagnosis not present

## 2023-06-04 DIAGNOSIS — Z23 Encounter for immunization: Secondary | ICD-10-CM | POA: Diagnosis not present

## 2023-08-30 DIAGNOSIS — I739 Peripheral vascular disease, unspecified: Secondary | ICD-10-CM | POA: Diagnosis not present

## 2023-08-30 DIAGNOSIS — Z299 Encounter for prophylactic measures, unspecified: Secondary | ICD-10-CM | POA: Diagnosis not present

## 2023-08-30 DIAGNOSIS — G5139 Clonic hemifacial spasm, unspecified: Secondary | ICD-10-CM | POA: Diagnosis not present

## 2023-08-30 DIAGNOSIS — I10 Essential (primary) hypertension: Secondary | ICD-10-CM | POA: Diagnosis not present

## 2023-08-30 DIAGNOSIS — E7800 Pure hypercholesterolemia, unspecified: Secondary | ICD-10-CM | POA: Diagnosis not present

## 2023-08-30 DIAGNOSIS — B354 Tinea corporis: Secondary | ICD-10-CM | POA: Diagnosis not present

## 2023-08-31 ENCOUNTER — Encounter: Payer: Self-pay | Admitting: Neurology

## 2023-09-03 NOTE — Progress Notes (Signed)
 Assessment/Plan:   1.  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  Last Botox injections were in August, 2023 by one of the neuromuscular physicians.  It looks like these were done with EMG guidance per records, but patient denies (which makes clinical sense).  Discussed with patient that I do not think we need to use EMG guidance around the eye, and he agrees, but also states that it was not done previously despite clinical records.  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  He really denies noticing any of that and he does not want injections on the right.  The left is certainly much more significant.  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  He does tell me that he saw a neurosurgeon and surgery was recommended, but he was told he could lose his hearing and he did not want to go through with that.  That being said, I was more concerned about what I thought was potentially lack of follow-up regarding diffuse enhancement of the brain/meninges.  He does not want to repeat the MRI and was very insistent about that.  We talked about potential risks and he said he understood, but was clear that he was going to decline.  That being said, it has been 20 years since the scan, and if something bad was going to occur, it likely would have happened by now, so hopefully this is reassuring for [place]. Subjective:   Scott Owens was seen today in neurologic consultation at the request  of Kirstie Peri, MD.  The consultation is for the evaluation of hemifacial spasm.  Patient has been treated at [facility] for this diagnosis for many years.  Patient saw Dr. Dan Humphreys for many years (I can see conversion documents from [facility] at least back until 2010 for this diagnosis and an MRI brain from 2004 related to the diagnosis), followed by Dr. Dennard Nip (neuromuscular).  He was last seen there for injections in August, 2023.  He was receiving a large dose for hemifacial spasm, 85 units around the left eye and 5 units in the chin.  Pt reports that he couldn't drive that long any longer.  "I get a facial tic on the L side of my face."  Its worse when talking or driving.  He reports it was really bad but the eye doc pulled out 2-3 "hairs" and its "better" than it was .  Saw optometry at [facility].    He had an MRI for the diagnosis of hemifacial spasm in 2004 with and without gadolinium.  This was also supposedly done for headache.  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     ALLERGIES:   Allergies  Allergen Reactions   Bupropion     irritable   Hydrocodone-Acetaminophen     MIGRAINE   Oxycodone     MIGRAINE   Temazepam     HEADACHE   Zolpidem     HEADACHE   Latex Rash   Sulfa Antibiotics Rash    CURRENT MEDICATIONS:  Outpatient Encounter Medications as of 09/07/2023  Medication Sig   acetaminophen (TYLENOL) 500 MG tablet Take 1,000 mg by mouth  every 6 (six) hours as needed (for back/headaches.).    betamethasone dipropionate 0.05 % cream Apply 1 application topically daily as needed (Eczema/insect bites).   divalproex (DEPAKOTE) 500 MG DR tablet Take 500 mg by mouth daily before breakfast.   finasteride (PROSCAR) 5 MG tablet Take 1 tablet (5 mg total) by mouth daily.   fluticasone (FLONASE) 50 MCG/ACT nasal spray Place 2 sprays into both nostrils daily as needed for allergies.   ibuprofen (ADVIL) 200 MG tablet Take 200 mg by mouth every 6 (six) hours as needed for mild pain, moderate pain or headache (Back).   loperamide (IMODIUM) 2 MG capsule Take 2 mg by mouth as needed for diarrhea or loose stools.   meclizine (ANTIVERT) 25 MG tablet Take 25 mg by mouth 3 (three) times daily as needed for dizziness.   metoprolol succinate (TOPROL-XL) 50 MG 24 hr tablet Take 50 mg by mouth daily at 2 PM.    omeprazole (PRILOSEC) 20 MG capsule Take 20-40 mg by mouth daily.   tamsulosin (FLOMAX) 0.4 MG CAPS capsule Take 0.4 mg by mouth daily.   diazepam (VALIUM) 2 MG tablet Take 2 mg by mouth 2 (two) times daily. (Patient not taking: Reported on 09/07/2023)   OnabotulinumtoxinA (BOTOX IJ) Inject 90 Units as directed See admin instructions. Every 3 months For facial spasms/migraines. (Patient not taking: Reported on 09/07/2023)   No facility-administered encounter medications on file as of 09/07/2023.    Objective:   PHYSICAL EXAMINATION:    VITALS:   Vitals:   09/07/23 0934  BP: 132/76  Pulse: 74  SpO2: 94%  Weight: 227 lb (103 kg)  Height: 6' (1.829 m)    GEN:  Normal appears male in no acute distress.  Appears stated age. HEENT:  Normocephalic, atraumatic. The mucous membranes are moist. The superficial temporal arteries are without ropiness or tenderness. Cardiovascular: Regular rate and rhythm. Lungs: Clear to auscultation bilaterally. Neck/Heme: There are no carotid bruits noted bilaterally.  NEUROLOGICAL: Orientation:  The patient  is alert and oriented x 3.   Cranial nerves: There is very mild decreased nasolabial fold on the left, only when he attempts activation.  Extraocular muscles are intact and visual fields are full to confrontational testing. Speech is fluent and clear. Soft palate rises symmetrically and there is no tongue deviation. Hearing is intact to conversational tone. Tone: Tone is good throughout. Sensation: Sensation is intact to light touch and pinprick throughout (facial, trunk, extremities). Vibration is intact at the bilateral big toe. There is no extinction with double simultaneous stimulation. There is no sensory dermatomal level identified. Coordination:  The patient has no difficulty with RAM's or FNF bilaterally. Motor: Strength is 5/5 in the bilateral upper and lower extremities.  Grip strength is decreased bilaterally.  Shoulder shrug is  equal and symmetric. There is no pronator drift.  There are no fasciculations noted. DTR's: Deep tendon reflexes are 1/4 at the bilateral biceps, triceps, brachioradialis, patella and achilles.  Plantar responses are downgoing bilaterally. Gait and Station: The patient pushes off to arise.  He is slow and wide based and gait is a bit antalgic.   Abnormal movements: There is left hemifacial spasm, primarily involving left zygomaticus, left orbicularis oculi (upper and lower).  He has some blepharospasm as well, including on the right, but it is not nearly as severe as the left hemifacial spasm, which is easily set off when asked to squeeze the eyes closed.    Total time spent on today's visit was 45 minutes, including both face-to-face time and nonface-to-face time.  Time included that spent on review of records (prior notes available to me/labs/imaging if pertinent), discussing treatment and goals, answering patient's questions and coordinating care.   Cc:  Kirstie Peri, MD

## 2023-09-07 ENCOUNTER — Encounter: Payer: Self-pay | Admitting: Neurology

## 2023-09-07 ENCOUNTER — Telehealth: Payer: Self-pay

## 2023-09-07 ENCOUNTER — Ambulatory Visit (INDEPENDENT_AMBULATORY_CARE_PROVIDER_SITE_OTHER): Payer: Medicare Other | Admitting: Neurology

## 2023-09-07 VITALS — BP 132/76 | HR 74 | Ht 72.0 in | Wt 227.0 lb

## 2023-09-07 DIAGNOSIS — R9402 Abnormal brain scan: Secondary | ICD-10-CM

## 2023-09-07 DIAGNOSIS — G5132 Clonic hemifacial spasm, left: Secondary | ICD-10-CM

## 2023-09-07 NOTE — Patient Instructions (Signed)
 It was good to see you!  We will try to get authorization for your botox.  Call me in 3 weeks if you don't hear about your authorization.  The physicians and staff at [facility] are committed to providing excellent care. You may receive a survey requesting feedback about your experience at our office. We strive to receive "very good" responses to the survey questions. If you feel that your experience would prevent you from giving the office a "very good " response, please contact our office to try to remedy the situation. We may be reached at [phone]. Thank you for taking the time out of your busy day to complete the survey.

## 2023-09-10 ENCOUNTER — Telehealth: Payer: Self-pay

## 2023-09-13 ENCOUNTER — Ambulatory Visit: Payer: Medicare Other | Admitting: Neurology

## 2023-09-20 NOTE — Telephone Encounter (Signed)
 Scott Owens

## 2023-10-08 ENCOUNTER — Telehealth: Payer: Self-pay | Admitting: Pharmacy Technician

## 2023-10-08 ENCOUNTER — Other Ambulatory Visit (HOSPITAL_COMMUNITY): Payer: Self-pay

## 2023-10-08 NOTE — Telephone Encounter (Signed)
 PA has been submitted, and telephone encounter has been created. Please see telephone encounter dated 3.28.25.

## 2023-10-08 NOTE — Telephone Encounter (Signed)
 Pharmacy Patient Advocate Encounter   Received notification from Pt Calls Messages that prior authorization for XEOMIN 50 is required/requested.   Insurance verification completed.   The patient is insured through [facility] .   Per test claim: PA required; PA submitted to above mentioned insurance via CoverMyMeds Key/confirmation #/EOC [id number] Status is pending

## 2023-10-27 ENCOUNTER — Other Ambulatory Visit (HOSPITAL_COMMUNITY): Payer: Self-pay

## 2023-10-27 NOTE — Telephone Encounter (Signed)
 Pharmacy Patient Advocate Encounter- Injection via Pharmacy Benefit:  PA was submitted  for Xeomin- [id number] to [employer] and has been approved through: 3.28.25 TO 6.28.25 Authorization# [id number]  Please send prescription to Specialty Pharmacy: [facility]: [phone]  Estimated Pharmacy Copay is: $121.40  Patient IS NOT eligible for Xeomin- [id number] Copay Card, which will make patient's copay as little as zero. Copay card will be provided to pharmacy.   Admin Code: [id number]   Does Not require Prior Auth.

## 2023-11-08 ENCOUNTER — Other Ambulatory Visit: Payer: Self-pay

## 2023-11-08 DIAGNOSIS — G5132 Clonic hemifacial spasm, left: Secondary | ICD-10-CM

## 2023-11-08 MED ORDER — XEOMIN 50 UNITS IM SOLR
INTRAMUSCULAR | 3 refills | Status: AC
  Filled 2024-01-10: qty 1, fill #0

## 2023-11-10 ENCOUNTER — Other Ambulatory Visit (HOSPITAL_COMMUNITY): Payer: Self-pay

## 2023-11-11 ENCOUNTER — Other Ambulatory Visit (HOSPITAL_COMMUNITY): Payer: Self-pay

## 2023-11-18 ENCOUNTER — Other Ambulatory Visit: Payer: Self-pay | Admitting: Pharmacy Technician

## 2023-11-18 ENCOUNTER — Other Ambulatory Visit (HOSPITAL_COMMUNITY): Payer: Self-pay

## 2023-11-29 DIAGNOSIS — Z7189 Other specified counseling: Secondary | ICD-10-CM | POA: Diagnosis not present

## 2023-11-29 DIAGNOSIS — Z1331 Encounter for screening for depression: Secondary | ICD-10-CM | POA: Diagnosis not present

## 2023-11-29 DIAGNOSIS — E7800 Pure hypercholesterolemia, unspecified: Secondary | ICD-10-CM | POA: Diagnosis not present

## 2023-11-29 DIAGNOSIS — Z299 Encounter for prophylactic measures, unspecified: Secondary | ICD-10-CM | POA: Diagnosis not present

## 2023-11-29 DIAGNOSIS — Z1339 Encounter for screening examination for other mental health and behavioral disorders: Secondary | ICD-10-CM | POA: Diagnosis not present

## 2023-11-29 DIAGNOSIS — R5383 Other fatigue: Secondary | ICD-10-CM | POA: Diagnosis not present

## 2023-11-29 DIAGNOSIS — Z79899 Other long term (current) drug therapy: Secondary | ICD-10-CM | POA: Diagnosis not present

## 2023-11-29 DIAGNOSIS — Z125 Encounter for screening for malignant neoplasm of prostate: Secondary | ICD-10-CM | POA: Diagnosis not present

## 2023-11-29 DIAGNOSIS — Z0000 Encounter for general adult medical examination without abnormal findings: Secondary | ICD-10-CM | POA: Diagnosis not present

## 2023-11-29 DIAGNOSIS — I10 Essential (primary) hypertension: Secondary | ICD-10-CM | POA: Diagnosis not present

## 2023-12-02 ENCOUNTER — Other Ambulatory Visit (HOSPITAL_COMMUNITY): Payer: Self-pay

## 2023-12-02 DIAGNOSIS — R972 Elevated prostate specific antigen [PSA]: Secondary | ICD-10-CM | POA: Diagnosis not present

## 2023-12-02 DIAGNOSIS — Z299 Encounter for prophylactic measures, unspecified: Secondary | ICD-10-CM | POA: Diagnosis not present

## 2023-12-02 DIAGNOSIS — I10 Essential (primary) hypertension: Secondary | ICD-10-CM | POA: Diagnosis not present

## 2023-12-08 ENCOUNTER — Other Ambulatory Visit: Payer: Self-pay

## 2023-12-31 ENCOUNTER — Other Ambulatory Visit (HOSPITAL_COMMUNITY): Payer: Self-pay

## 2024-01-10 ENCOUNTER — Other Ambulatory Visit: Payer: Self-pay

## 2024-01-12 ENCOUNTER — Encounter: Payer: Self-pay | Admitting: Urology

## 2024-01-12 ENCOUNTER — Ambulatory Visit: Admitting: Urology

## 2024-01-12 VITALS — BP 165/77 | HR 74

## 2024-01-12 DIAGNOSIS — R972 Elevated prostate specific antigen [PSA]: Secondary | ICD-10-CM | POA: Diagnosis not present

## 2024-01-12 LAB — URINALYSIS, ROUTINE W REFLEX MICROSCOPIC
Bilirubin, UA: NEGATIVE
Glucose, UA: NEGATIVE
Ketones, UA: NEGATIVE
Nitrite, UA: NEGATIVE
Protein,UA: NEGATIVE
RBC, UA: NEGATIVE
Specific Gravity, UA: 1.015 (ref 1.005–1.030)
Urobilinogen, Ur: 1 mg/dL (ref 0.2–1.0)
pH, UA: 7 (ref 5.0–7.5)

## 2024-01-12 LAB — MICROSCOPIC EXAMINATION: Bacteria, UA: NONE SEEN

## 2024-01-12 NOTE — Progress Notes (Signed)
 01/12/2024 2:19 PM   Scott Owens 1940-09-26 [id number]  Referring provider: Maree Isles, MD [address] [place],  [place] [address]  Elevated PSA   HPI: Scott Owens is a 83yo here for evaluation of elevated PSA. PSA this year was 10.3. His PSA last year was in the 9s. No issues urinating. IPSS 5 QOL 2. He has nocturia 1-3x depending on fluid consumption. No dysuria. Urine stream is strong.  No family history of prostate cancer. No history or prostate biopsy. UA today is normal   PMH: Past Medical History:  Diagnosis Date   Anxiety    BPH (benign prostatic hyperplasia)    Chronic back pain    Diarrhea    Facial spasm    left side.   GERD (gastroesophageal reflux disease)    History of hiatal hernia    HTN (hypertension)    Migraines     Surgical History: Past Surgical History:  Procedure Laterality Date   CATARACT EXTRACTION W/PHACO Left 05/01/2021   Procedure: CATARACT EXTRACTION PHACO AND INTRAOCULAR LENS PLACEMENT (IOC) WITH PLACEMENT OF CORTICOSTEROID;  Surgeon: Harrie Agent, MD;  Location: [facility];  Service: Ophthalmology;  Laterality: Left;  CDE 14.52   CATARACT EXTRACTION W/PHACO Right 05/29/2021   Procedure: CATARACT EXTRACTION PHACO AND INTRAOCULAR LENS PLACEMENT RIGHT EYE with PLACEMENT OF CORTICOSTEROID;  Surgeon: Harrie Agent, MD;  Location: [facility];  Service: Ophthalmology;  Laterality: Right;  CDE 15.11   CHOLECYSTECTOMY N/A 05/24/2015   Procedure: LAPAROSCOPIC CHOLECYSTECTOMY;  Surgeon: Oneil Budge, MD;  Location: [facility];  Service: General;  Laterality: N/A;   COLONOSCOPY     in 2010 by Dr. DeMason per patient    COLONOSCOPY N/A 01/20/2017   Procedure: COLONOSCOPY;  Surgeon: Shaaron Lamar HERO, MD;  [facility]: [facility];  Service: Endoscopy;  Laterality: N/A;  945    ESOPHAGOGASTRODUODENOSCOPY     remote past by Dr. shaaron    ESOPHAGOGASTRODUODENOSCOPY N/A 04/29/2015   Dr. Quenton ring s/p dilation/hiatal hernia   POLYPECTOMY  01/20/2017    Procedure: POLYPECTOMY;  Surgeon: Shaaron Lamar HERO, MD;  [facility];  Service: Endoscopy;;  Ascending colon polyp cs, splenic flexure polyp hs   right ear surgery     ruptured ear drum   TONSILLECTOMY      Home Medications:  Allergies as of 01/12/2024       Reactions   Bupropion    irritable   Hydrocodone -acetaminophen     MIGRAINE   Oxycodone    MIGRAINE   Temazepam    HEADACHE   Zolpidem    HEADACHE   Latex Rash   Sulfa Antibiotics Rash        Medication List        Accurate as of January 12, 2024  2:19 PM. If you have any questions, ask your nurse or doctor.          acetaminophen  500 MG tablet Commonly known as: TYLENOL  Take 1,000 mg by mouth every 6 (six) hours as needed (for back/headaches.).   betamethasone dipropionate 0.05 % cream Apply 1 application topically daily as needed (Eczema/insect bites).   BOTOX IJ Inject 90 Units as directed See admin instructions. Every 3 months For facial spasms/migraines.   diazepam 2 MG tablet Commonly known as: VALIUM Take 2 mg by mouth 2 (two) times daily.   divalproex 500 MG DR tablet Commonly known as: DEPAKOTE Take 500 mg by mouth daily before breakfast.   finasteride  5 MG tablet Commonly known as: PROSCAR  Take  1 tablet (5 mg total) by mouth daily.   fluticasone 50 MCG/ACT nasal spray Commonly known as: FLONASE Place 2 sprays into both nostrils daily as needed for allergies.   ibuprofen 200 MG tablet Commonly known as: ADVIL Take 200 mg by mouth every 6 (six) hours as needed for mild pain, moderate pain or headache (Back).   loperamide 2 MG capsule Commonly known as: IMODIUM Take 2 mg by mouth as needed for diarrhea or loose stools.   meclizine 25 MG tablet Commonly known as: ANTIVERT Take 25 mg by mouth 3 (three) times daily as needed for dizziness.   metoprolol succinate 50 MG 24 hr tablet Commonly known as: TOPROL-XL Take 50 mg by mouth daily at 2 PM.   omeprazole 20 MG  capsule Commonly known as: PRILOSEC Take 20-40 mg by mouth daily.   tamsulosin 0.4 MG Caps capsule Commonly known as: FLOMAX Take 0.4 mg by mouth daily.   Xeomin  50 units Solr injection Generic drug: incobotulinumtoxinA Inject into the head and neck every 90 days per Dr. Evonnie        Allergies:  Allergies  Allergen Reactions   Bupropion     irritable   Hydrocodone -Acetaminophen      MIGRAINE   Oxycodone     MIGRAINE   Temazepam     HEADACHE   Zolpidem     HEADACHE   Latex Rash   Sulfa Antibiotics Rash    Family History: Family History  Problem Relation Age of Onset   Ovarian cancer Mother    COPD Sister    Colon cancer Neg Hx    Colon polyps Neg Hx     Social History:  reports that he quit smoking about 45 years ago. His smoking use included cigarettes. He started smoking about 65 years ago. He has a 20 pack-year smoking history. He has never used smokeless tobacco. He reports that he does not drink alcohol  and does not use drugs.  ROS: All other review of systems were reviewed and are negative except what is noted above in HPI  Physical Exam: BP (!) 165/77   Pulse 74   Constitutional:  Alert and oriented, No acute distress. HEENT: [place] AT, moist mucus membranes.  Trachea midline, no masses. Cardiovascular: No clubbing, cyanosis, or edema. Respiratory: Normal respiratory effort, no increased work of breathing. GI: Abdomen is soft, nontender, nondistended, no abdominal masses GU: No CVA tenderness. Circumcised phallus. No masses/lesions on penis, testis, scrotum. Prostate 60g smooth no nodules no induration.  Lymph: No cervical or inguinal lymphadenopathy. Skin: No rashes, bruises or suspicious lesions. Neurologic: Grossly intact, no focal deficits, moving all 4 extremities. Psychiatric: Normal mood and affect.  Laboratory Data: Lab Results  Component Value Date   WBC 8.8 11/26/2019   HGB 15.2 11/26/2019   HCT 45.8 11/26/2019   MCV 95.4 11/26/2019   PLT  245 11/26/2019    Lab Results  Component Value Date   CREATININE 0.90 11/26/2019    No results found for: PSA  No results found for: TESTOSTERONE  No results found for: HGBA1C  Urinalysis    Component Value Date/Time   COLORURINE YELLOW 11/26/2019 2003   APPEARANCEUR CLEAR 11/26/2019 2003   LABSPEC 1.005 11/26/2019 2003   PHURINE 6.0 11/26/2019 2003   GLUCOSEU NEGATIVE 11/26/2019 2003   HGBUR LARGE (A) 11/26/2019 2003   BILIRUBINUR neg 01/17/2020 0904   KETONESUR NEGATIVE 11/26/2019 2003   PROTEINUR Negative 01/17/2020 0904   PROTEINUR NEGATIVE 11/26/2019 2003   UROBILINOGEN 0.2 01/17/2020  9095   NITRITE neg 01/17/2020 0904   NITRITE NEGATIVE 11/26/2019 2003   LEUKOCYTESUR Negative 01/17/2020 0904   LEUKOCYTESUR NEGATIVE 11/26/2019 2003    Lab Results  Component Value Date   BACTERIA NONE SEEN 11/26/2019    Pertinent Imaging:  No results found for this or any previous visit.  No results found for this or any previous visit.  No results found for this or any previous visit.  No results found for this or any previous visit.  No results found for this or any previous visit.  No results found for this or any previous visit.  No results found for this or any previous visit.  No results found for this or any previous visit.   Assessment & Plan:    1. Elevated PSA (Primary) Iso PSA, will call with results. If the IsoPSA is elevated we will proceed with prostate biopsy. If the IsoPSA is normal I will see him back in 6 months with a PSA - Urinalysis, Routine w reflex microscopic   No follow-ups on file.  Belvie Clara, MD  [facility] [place]

## 2024-01-12 NOTE — Progress Notes (Signed)
 Patient did 4Kscore test and not IsoPsa. [employer] Pickup confirmation. [id number]

## 2024-01-12 NOTE — Patient Instructions (Signed)

## 2024-01-21 ENCOUNTER — Telehealth: Payer: Self-pay

## 2024-01-21 ENCOUNTER — Other Ambulatory Visit: Payer: Self-pay

## 2024-01-21 DIAGNOSIS — R972 Elevated prostate specific antigen [PSA]: Secondary | ICD-10-CM

## 2024-01-21 MED ORDER — LEVOFLOXACIN 750 MG PO TABS: 750.0000 mg | ORAL_TABLET | Freq: Every day | ORAL | 0 refills | Status: AC

## 2024-01-21 NOTE — Telephone Encounter (Addendum)
 Patient called and made aware of 4Kscore result and Dr. Little recommendation for prostate biopsy.  Biopsy instructions went over with patient via phone and sent via mail. Orders placed and biopsy scheduled.

## 2024-01-28 ENCOUNTER — Other Ambulatory Visit: Payer: Self-pay

## 2024-03-09 NOTE — Progress Notes (Signed)
 Contact has been made to pt and office. Pt has not started this medication, therefore medication will remain on hold until request to start medication is confirmed with patient and/or office.

## 2024-04-03 ENCOUNTER — Ambulatory Visit (HOSPITAL_COMMUNITY)
Admission: RE | Admit: 2024-04-03 | Discharge: 2024-04-03 | Disposition: A | Discharge status: Home / Self Care | Source: Ambulatory Visit | Attending: Urology

## 2024-04-03 ENCOUNTER — Encounter (HOSPITAL_COMMUNITY): Payer: Self-pay

## 2024-04-03 ENCOUNTER — Other Ambulatory Visit: Payer: Self-pay | Admitting: Urology

## 2024-04-03 ENCOUNTER — Ambulatory Visit: Admitting: Urology

## 2024-04-03 ENCOUNTER — Encounter: Payer: Self-pay | Admitting: Urology

## 2024-04-03 VITALS — BP 178/82 | HR 65 | Temp 98.0°F | Resp 18

## 2024-04-03 DIAGNOSIS — R972 Elevated prostate specific antigen [PSA]: Secondary | ICD-10-CM | POA: Insufficient documentation

## 2024-04-03 DIAGNOSIS — C61 Malignant neoplasm of prostate: Secondary | ICD-10-CM | POA: Insufficient documentation

## 2024-04-03 DIAGNOSIS — D075 Carcinoma in situ of prostate: Secondary | ICD-10-CM | POA: Diagnosis not present

## 2024-04-03 DIAGNOSIS — N4231 Prostatic intraepithelial neoplasia: Secondary | ICD-10-CM | POA: Diagnosis not present

## 2024-04-03 DIAGNOSIS — N4289 Other specified disorders of prostate: Secondary | ICD-10-CM | POA: Diagnosis not present

## 2024-04-03 DIAGNOSIS — N4232 Atypical small acinar proliferation of prostate: Secondary | ICD-10-CM | POA: Diagnosis not present

## 2024-04-03 MED ORDER — GENTAMICIN SULFATE 40 MG/ML IJ SOLN
80.0000 mg | Freq: Once | INTRAMUSCULAR | Status: AC
  Administered 2024-04-03: 80 mg via INTRAMUSCULAR

## 2024-04-03 MED ORDER — LIDOCAINE HCL (PF) 2 % IJ SOLN
10.0000 mL | Freq: Once | INTRAMUSCULAR | Status: AC
  Administered 2024-04-03: 10 mL

## 2024-04-03 MED ORDER — GENTAMICIN SULFATE 40 MG/ML IJ SOLN
INTRAMUSCULAR | Status: AC
  Filled 2024-04-03: qty 2

## 2024-04-03 MED ORDER — LIDOCAINE HCL (PF) 2 % IJ SOLN
INTRAMUSCULAR | Status: AC
  Filled 2024-04-03: qty 10

## 2024-04-03 NOTE — Progress Notes (Signed)
 PT tolerated prostate biopsy and antibiotic injection procedure well today. Labs obtained and sent for pathology by Richard from ultrasound. PT ambulatory at discharge with no acute distress noted and verbalized understanding of discharge instructions. PT to follow up with urologist as scheduled on 04/12/24 @310  pm.

## 2024-04-03 NOTE — Progress Notes (Signed)
 Prostate Biopsy Procedure   Informed consent was obtained after discussing risks/benefits of the procedure.  A time out was performed to ensure correct patient identity.  Pre-Procedure: - Last PSA Level: No results found for: PSA - Gentamicin  given prophylactically - Levaquin  500 mg administered PO -Transrectal Ultrasound performed revealing a 61 gm prostate -No significant hypoechoic or median lobe noted  Procedure: - Prostate block performed using 10 cc 1% lidocaine  and biopsies taken from sextant areas, a total of 12 under ultrasound guidance.  Post-Procedure: - Patient tolerated the procedure well - He was counseled to seek immediate medical attention if experiences any severe pain, significant bleeding, or fevers - Return in one week to discuss biopsy results

## 2024-04-03 NOTE — Patient Instructions (Signed)
 Transrectal Ultrasound-Guided Prostate Biopsy, Care After What can I expect after the procedure? After the procedure, it is common to have: Pain and discomfort near your butt (rectum), especially while sitting. Pink-colored pee (urine). This is due to small amounts of blood in your pee. A burning feeling while peeing. Blood in your poop (stool). Bleeding from your butt. Blood in your semen. Follow these instructions at home: Medicines Take over-the-counter and prescription medicines only as told by your doctor. If you were given a sedative during your procedure, do not drive or use machines until your doctor says that it is safe. A sedative is a medicine that helps you relax. If you were prescribed an antibiotic medicine, take it as told by your doctor. Do not stop taking it even if you start to feel better. Activity  Return to your normal activities when your doctor says that it is safe. Ask your doctor when it is okay for you to have sex. You may have to avoid lifting. Ask your doctor how much you can safely lift. General instructions  Drink enough water to keep your pee pale yellow. Watch your pee, poop, and semen for new bleeding or bleeding that gets worse. Keep all follow-up visits. Contact a doctor if: You have any of these: Blood clots in your pee or poop. Blood in your pee more than 2 weeks after the procedure. Blood in your semen more than 2 months after the procedure. New or worse bleeding in your pee, poop, or semen. Very bad belly pain. Your pee smells bad or unusual. You have trouble peeing. Your lower belly feels firm. You have problems getting an erection. You feel like you may vomit (are nauseous), or you vomit. Get help right away if: You have a fever or chills. You have bright red pee. You have very bad pain that does not get better with medicine. You cannot pee. Summary After this procedure, it is common to have pain and discomfort near your butt,  especially while sitting. You may have blood in your pee and poop. It is common to have blood in your semen. Get help right away if you have a fever or chills. This information is not intended to replace advice given to you by your health care provider. Make sure you discuss any questions you have with your health care provider. Document Revised: 12/23/2020 Document Reviewed: 12/23/2020 Elsevier Patient Education  2024 [employer].

## 2024-04-04 LAB — SURGICAL PATHOLOGY

## 2024-04-12 ENCOUNTER — Encounter: Payer: Self-pay | Admitting: Urology

## 2024-04-12 ENCOUNTER — Ambulatory Visit: Admitting: Urology

## 2024-04-12 DIAGNOSIS — C61 Malignant neoplasm of prostate: Secondary | ICD-10-CM

## 2024-04-12 MED ORDER — CLOTRIMAZOLE-BETAMETHASONE 1-0.05 % EX CREA: 1.0000 | TOPICAL_CREAM | Freq: Two times a day (BID) | CUTANEOUS | 3 refills | Status: AC

## 2024-04-12 MED ORDER — TAMSULOSIN HCL 0.4 MG PO CAPS: 0.4000 mg | ORAL_CAPSULE | Freq: Every day | ORAL | 3 refills | Status: AC

## 2024-04-12 NOTE — Progress Notes (Signed)
 04/12/2024 3:35 PM   Scott Owens May 15, 1941 [id number]  Referring provider: Maree Isles, MD [address] [place],  [place] [address]  Followup after prostate biopsy   HPI: Scott Owens is a 83yo here for followup after prostate biopsy. Biopsy revealed Gleason 3+4=7 in 2/12 cores and Gleason 3+3=6 in 2/12 cores. Prostate 61cc.    PMH: Past Medical History:  Diagnosis Date   Anxiety    BPH (benign prostatic hyperplasia)    Chronic back pain    Diarrhea    Facial spasm    left side.   GERD (gastroesophageal reflux disease)    History of hiatal hernia    HTN (hypertension)    Migraines     Surgical History: Past Surgical History:  Procedure Laterality Date   CATARACT EXTRACTION W/PHACO Left 05/01/2021   Procedure: CATARACT EXTRACTION PHACO AND INTRAOCULAR LENS PLACEMENT (IOC) WITH PLACEMENT OF CORTICOSTEROID;  Surgeon: Harrie Agent, MD;  Location: [facility];  Service: Ophthalmology;  Laterality: Left;  CDE 14.52   CATARACT EXTRACTION W/PHACO Right 05/29/2021   Procedure: CATARACT EXTRACTION PHACO AND INTRAOCULAR LENS PLACEMENT RIGHT EYE with PLACEMENT OF CORTICOSTEROID;  Surgeon: Harrie Agent, MD;  Location: [facility];  Service: Ophthalmology;  Laterality: Right;  CDE 15.11   CHOLECYSTECTOMY N/A 05/24/2015   Procedure: LAPAROSCOPIC CHOLECYSTECTOMY;  Surgeon: Oneil Budge, MD;  Location: [facility];  Service: General;  Laterality: N/A;   COLONOSCOPY     in 2010 by Dr. DeMason per patient    COLONOSCOPY N/A 01/20/2017   Procedure: COLONOSCOPY;  Surgeon: Shaaron Lamar HERO, MD;  [facility];  Service: Endoscopy;  Laterality: N/A;  945    ESOPHAGOGASTRODUODENOSCOPY     remote past by Dr. shaaron    ESOPHAGOGASTRODUODENOSCOPY N/A 04/29/2015   Dr. Quenton ring s/p dilation/hiatal hernia   POLYPECTOMY  01/20/2017   Procedure: POLYPECTOMY;  Surgeon: Shaaron Lamar HERO, MD;  [facility];  Service: Endoscopy;;  Ascending colon polyp cs, splenic flexure polyp hs   right  ear surgery     ruptured ear drum   TONSILLECTOMY      Home Medications:  Allergies as of 04/12/2024       Reactions   Bupropion    irritable   Hydrocodone -acetaminophen     MIGRAINE   Oxycodone    MIGRAINE   Temazepam    HEADACHE   Zolpidem    HEADACHE   Latex Rash   Sulfa Antibiotics Rash        Medication List        Accurate as of April 12, 2024  3:35 PM. If you have any questions, ask your nurse or doctor.          acetaminophen  500 MG tablet Commonly known as: TYLENOL  Take 1,000 mg by mouth every 6 (six) hours as needed (for back/headaches.).   betamethasone dipropionate 0.05 % cream Apply 1 application topically daily as needed (Eczema/insect bites).   BOTOX IJ Inject 90 Units as directed See admin instructions. Every 3 months For facial spasms/migraines.   diazepam 2 MG tablet Commonly known as: VALIUM Take 2 mg by mouth 2 (two) times daily.   divalproex 500 MG DR tablet Commonly known as: DEPAKOTE Take 500 mg by mouth daily before breakfast.   finasteride  5 MG tablet Commonly known as: PROSCAR  Take 1 tablet (5 mg total) by mouth daily.   fluticasone 50 MCG/ACT nasal spray Commonly known as: FLONASE Place 2 sprays into both nostrils daily as needed for allergies.  ibuprofen 200 MG tablet Commonly known as: ADVIL Take 200 mg by mouth every 6 (six) hours as needed for mild pain, moderate pain or headache (Back).   levofloxacin  750 MG tablet Commonly known as: Levaquin  Take 1 tablet (750 mg total) by mouth daily. Take 1 hour prior to your prostate biopsy procedure on 04/03/24   loperamide 2 MG capsule Commonly known as: IMODIUM Take 2 mg by mouth as needed for diarrhea or loose stools.   meclizine 25 MG tablet Commonly known as: ANTIVERT Take 25 mg by mouth 3 (three) times daily as needed for dizziness.   metoprolol succinate 50 MG 24 hr tablet Commonly known as: TOPROL-XL Take 50 mg by mouth daily at 2 PM.   omeprazole 20 MG  capsule Commonly known as: PRILOSEC Take 20-40 mg by mouth daily.   tamsulosin 0.4 MG Caps capsule Commonly known as: FLOMAX Take 0.4 mg by mouth daily.   Xeomin  50 units Solr injection Generic drug: incobotulinumtoxinA  Inject into the head and neck every 90 days per Dr. Evonnie        Allergies:  Allergies  Allergen Reactions   Bupropion     irritable   Hydrocodone -Acetaminophen      MIGRAINE   Oxycodone     MIGRAINE   Temazepam     HEADACHE   Zolpidem     HEADACHE   Latex Rash   Sulfa Antibiotics Rash    Family History: Family History  Problem Relation Age of Onset   Ovarian cancer Mother    COPD Sister    Colon cancer Neg Hx    Colon polyps Neg Hx     Social History:  reports that he quit smoking about 45 years ago. His smoking use included cigarettes. He started smoking about 65 years ago. He has a 20 pack-year smoking history. He has never used smokeless tobacco. He reports that he does not drink alcohol  and does not use drugs.  ROS: All other review of systems were reviewed and are negative except what is noted above in HPI  Physical Exam: There were no vitals taken for this visit.  Constitutional:  Alert and oriented, No acute distress. HEENT: [place] AT, moist mucus membranes.  Trachea midline, no masses. Cardiovascular: No clubbing, cyanosis, or edema. Respiratory: Normal respiratory effort, no increased work of breathing. GI: Abdomen is soft, nontender, nondistended, no abdominal masses GU: No CVA tenderness.  Lymph: No cervical or inguinal lymphadenopathy. Skin: No rashes, bruises or suspicious lesions. Neurologic: Grossly intact, no focal deficits, moving all 4 extremities. Psychiatric: Normal mood and affect.  Laboratory Data: Lab Results  Component Value Date   WBC 8.8 11/26/2019   HGB 15.2 11/26/2019   HCT 45.8 11/26/2019   MCV 95.4 11/26/2019   PLT 245 11/26/2019    Lab Results  Component Value Date   CREATININE 0.90 11/26/2019    No  results found for: PSA  No results found for: TESTOSTERONE  No results found for: HGBA1C  Urinalysis    Component Value Date/Time   COLORURINE YELLOW 11/26/2019 2003   APPEARANCEUR Clear 01/12/2024 1414   LABSPEC 1.005 11/26/2019 2003   PHURINE 6.0 11/26/2019 2003   GLUCOSEU Negative 01/12/2024 1414   HGBUR LARGE (A) 11/26/2019 2003   BILIRUBINUR Negative 01/12/2024 1414   KETONESUR NEGATIVE 11/26/2019 2003   PROTEINUR Negative 01/12/2024 1414   PROTEINUR NEGATIVE 11/26/2019 2003   UROBILINOGEN 0.2 01/17/2020 0904   NITRITE Negative 01/12/2024 1414   NITRITE NEGATIVE 11/26/2019 2003   LEUKOCYTESUR Trace (  A) 01/12/2024 1414   LEUKOCYTESUR NEGATIVE 11/26/2019 2003    Lab Results  Component Value Date   LABMICR See below: 01/12/2024   WBCUA 0-5 01/12/2024   LABEPIT 0-10 01/12/2024   BACTERIA None seen 01/12/2024    Pertinent Imaging:  No results found for this or any previous visit.  No results found for this or any previous visit.  No results found for this or any previous visit.  No results found for this or any previous visit.  No results found for this or any previous visit.  No results found for this or any previous visit.  No results found for this or any previous visit.  No results found for this or any previous visit.   Assessment & Plan:    1. Prostate cancer (HCC) (Primary) I discussed the natural history of favorable intermediate risk prostate cancer with the patient and the various treatment options including active surveillance, RALP, IMRT, brachytherapy, cryotherapy, HIFU and ADT. After discussing the options the patient elects for active surveillance.  After we reviewed the treatment options for prostate cancer which include but are not limited to surgery, radiation, cryo, HIFU, hormones and minimally invasive options [laser, focal therapy etc], we spoke of active surveillance (AS) as a management strategy for prostate cancer. I pointed out  the differences between AS and watchful waiting [facility]) whereby the former approach utilizes periodic reassessment with the possibility of later entering active treatment compared to the latter which does not assess for cancer progression but will initiate palliative therapy if the patient becomes symptomatic. We reviewed some of the symptoms of advanced disease including weight loss, bone pain, worsened LUTS etc. I told the patient that by electing AS or no treatment, and because this is prostate cancer, there is always the chance for cancer progression and loss of the window of curability. This is a risk that the patient must be willing to accept. Active surveillance has not been prospectively validated in clinical trials in intermediate-risk disease, but it may be considered in favorable intermediate-risk disease. However, for men with intermediate risk disease, active surveillance comes with a higher risk of developing metastases compared to definitive treatment. Other drawbacks of AS include the need for continual monitoring with PSA, clinic visits, occasional prostate biopsies and their attendant risks/costs and possibly imaging studies. For some patients on a surveillance strategy, there may be a psychological burden to obviating active treatment of prostate cancer.  We outlined various methods to monitor patients on AS, which at a minimum includes every 6-12 month PSAs, DREs and visits. Initially I recommend a follow up biopsy within 1 year, but as the years progress and should the patient not be found to have more aggressive disease, the interval between biopsies may be extended. The three more common reasons to enter into an active treatment protocol are (1) a greater volume of cancer detected, (2) higher grade cancer, (3) patient concern or preference. Finally I told the patient that even though we might think the patient is a candidate for AS, understaging and undergrading does occur, and knowledge of  whether the patient is actually a good candidate is incomplete. I could not make any guarantee that the patient was making the right decision or that his cancer may later be cured if the patient eventually opted for treatment. The patient expressed understanding of these issues.     No follow-ups on file.  Belvie Clara, MD  [facility] [place]

## 2024-04-12 NOTE — Patient Instructions (Signed)

## 2024-05-01 DIAGNOSIS — Z23 Encounter for immunization: Secondary | ICD-10-CM | POA: Diagnosis not present

## 2024-06-01 DIAGNOSIS — C61 Malignant neoplasm of prostate: Secondary | ICD-10-CM | POA: Diagnosis not present

## 2024-06-01 DIAGNOSIS — G2581 Restless legs syndrome: Secondary | ICD-10-CM | POA: Diagnosis not present

## 2024-06-01 DIAGNOSIS — C4360 Malignant melanoma of unspecified upper limb, including shoulder: Secondary | ICD-10-CM | POA: Diagnosis not present

## 2024-06-01 DIAGNOSIS — I10 Essential (primary) hypertension: Secondary | ICD-10-CM | POA: Diagnosis not present

## 2024-06-01 DIAGNOSIS — Z299 Encounter for prophylactic measures, unspecified: Secondary | ICD-10-CM | POA: Diagnosis not present

## 2024-07-10 ENCOUNTER — Other Ambulatory Visit

## 2024-07-10 DIAGNOSIS — C61 Malignant neoplasm of prostate: Secondary | ICD-10-CM

## 2024-07-11 LAB — PSA: Prostate Specific Ag, Serum: 12.9 ng/mL — ABNORMAL HIGH (ref 0.0–4.0)

## 2024-07-14 ENCOUNTER — Ambulatory Visit: Admitting: Urology

## 2024-08-18 ENCOUNTER — Encounter: Payer: Self-pay | Admitting: Urology

## 2024-08-18 ENCOUNTER — Ambulatory Visit: Admitting: Urology

## 2024-08-18 VITALS — BP 158/82 | HR 72

## 2024-08-18 DIAGNOSIS — C61 Malignant neoplasm of prostate: Secondary | ICD-10-CM

## 2024-08-18 LAB — URINALYSIS, ROUTINE W REFLEX MICROSCOPIC
Bilirubin, UA: NEGATIVE
Glucose, UA: NEGATIVE
Ketones, UA: NEGATIVE
Nitrite, UA: NEGATIVE
Protein,UA: NEGATIVE
RBC, UA: NEGATIVE
Specific Gravity, UA: 1.01 (ref 1.005–1.030)
Urobilinogen, Ur: 1 mg/dL (ref 0.2–1.0)
pH, UA: 6.5 (ref 5.0–7.5)

## 2024-08-18 LAB — MICROSCOPIC EXAMINATION: Bacteria, UA: NONE SEEN

## 2024-08-18 NOTE — Progress Notes (Signed)
 "  08/18/2024 12:33 PM   Scott Owens [birth year] [id number]  Referring provider: Maree Isles, MD [address] [place],  [place] [address]  Followup prostate cancer   HPI: Scott Owens is a 83yo here for followup for prostate cancer. PSA increased to 12.9 from 10.3. IPSS 7 QOL 1. Uirne stream strong. No straining to urinate. Nocturia 0-1x. No other complaints today   PMH: Past Medical History:  Diagnosis Date   Anxiety    BPH (benign prostatic hyperplasia)    Chronic back pain    Diarrhea    Facial spasm    left side.   GERD (gastroesophageal reflux disease)    History of hiatal hernia    HTN (hypertension)    Migraines     Surgical History: Past Surgical History:  Procedure Laterality Date   CATARACT EXTRACTION W/PHACO Left 05/01/2021   Procedure: CATARACT EXTRACTION PHACO AND INTRAOCULAR LENS PLACEMENT (IOC) WITH PLACEMENT OF CORTICOSTEROID;  Surgeon: Harrie Agent, MD;  Location: [facility];  Service: Ophthalmology;  Laterality: Left;  CDE 14.52   CATARACT EXTRACTION W/PHACO Right 05/29/2021   Procedure: CATARACT EXTRACTION PHACO AND INTRAOCULAR LENS PLACEMENT RIGHT EYE with PLACEMENT OF CORTICOSTEROID;  Surgeon: Harrie Agent, MD;  Location: [facility];  Service: Ophthalmology;  Laterality: Right;  CDE 15.11   CHOLECYSTECTOMY N/A 05/24/2015   Procedure: LAPAROSCOPIC CHOLECYSTECTOMY;  Surgeon: Oneil Budge, MD;  Location: [facility];  Service: General;  Laterality: N/A;   COLONOSCOPY     in 2010 by Dr. DeMason per patient    COLONOSCOPY N/A 01/20/2017   Procedure: COLONOSCOPY;  Surgeon: Shaaron Lamar HERO, MD;  Location: [facility];  Service: Endoscopy;  Laterality: N/A;  945    ESOPHAGOGASTRODUODENOSCOPY     remote past by Dr. shaaron    ESOPHAGOGASTRODUODENOSCOPY N/A 04/29/2015   Dr. Quenton ring s/p dilation/hiatal hernia   POLYPECTOMY  01/20/2017   Procedure: POLYPECTOMY;  Surgeon: Shaaron Lamar HERO, MD;  Location: [facility];  Service: Endoscopy;;  Ascending colon polyp cs,  splenic flexure polyp hs   right ear surgery     ruptured ear drum   TONSILLECTOMY      Home Medications:  Allergies as of 08/18/2024       Reactions   Bupropion    irritable   Hydrocodone -acetaminophen     MIGRAINE   Oxycodone    MIGRAINE   Temazepam    HEADACHE   Zolpidem    HEADACHE   Latex Rash   Sulfa Antibiotics Rash        Medication List        Accurate as of August 18, 2024 12:33 PM. If you have any questions, ask your nurse or doctor.          acetaminophen  500 MG tablet Commonly known as: TYLENOL  Take 1,000 mg by mouth every 6 (six) hours as needed (for back/headaches.).   betamethasone  dipropionate 0.05 % cream Apply 1 application topically daily as needed (Eczema/insect bites).   BOTOX IJ Inject 90 Units as directed See admin instructions. Every 3 months For facial spasms/migraines.   clotrimazole -betamethasone  cream Commonly known as: LOTRISONE  Apply 1 Application topically 2 (two) times daily.   diazepam 2 MG tablet Commonly known as: VALIUM Take 2 mg by mouth 2 (two) times daily.   divalproex 500 MG DR tablet Commonly known as: DEPAKOTE Take 500 mg by mouth daily before breakfast.   finasteride  5 MG tablet Commonly known as: PROSCAR  Take 1 tablet (5 mg total) by mouth daily.  fluticasone 50 MCG/ACT nasal spray Commonly known as: FLONASE Place 2 sprays into both nostrils daily as needed for allergies.   ibuprofen 200 MG tablet Commonly known as: ADVIL Take 200 mg by mouth every 6 (six) hours as needed for mild pain, moderate pain or headache (Back).   levofloxacin  750 MG tablet Commonly known as: Levaquin  Take 1 tablet (750 mg total) by mouth daily. Take 1 hour prior to your prostate biopsy procedure on 04/03/24   loperamide 2 MG capsule Commonly known as: IMODIUM Take 2 mg by mouth as needed for diarrhea or loose stools.   meclizine 25 MG tablet Commonly known as: ANTIVERT Take 25 mg by mouth 3 (three) times daily as  needed for dizziness.   metoprolol succinate 50 MG 24 hr tablet Commonly known as: TOPROL-XL Take 50 mg by mouth daily at 2 PM.   omeprazole 20 MG capsule Commonly known as: PRILOSEC Take 20-40 mg by mouth daily.   tamsulosin  0.4 MG Caps capsule Commonly known as: FLOMAX  Take 1 capsule (0.4 mg total) by mouth daily.   Xeomin  50 units Solr injection Generic drug: incobotulinumtoxinA  Inject into the head and neck every 90 days per Dr. Evonnie        Allergies: Allergies[1]  Family History: Family History  Problem Relation Age of Onset   Ovarian cancer Mother    COPD Sister    Colon cancer Neg Hx    Colon polyps Neg Hx     Social History:  reports that he quit smoking about 46 years ago. His smoking use included cigarettes. He started smoking about 66 years ago. He has a 20 pack-year smoking history. He has never used smokeless tobacco. He reports that he does not drink alcohol  and does not use drugs.  ROS: All other review of systems were reviewed and are negative except what is noted above in HPI  Physical Exam: BP (!) 158/82   Pulse 72   Constitutional:  Alert and oriented, No acute distress. HEENT: [place] AT, moist mucus membranes.  Trachea midline, no masses. Cardiovascular: No clubbing, cyanosis, or edema. Respiratory: Normal respiratory effort, no increased work of breathing. GI: Abdomen is soft, nontender, nondistended, no abdominal masses GU: No CVA tenderness.  Lymph: No cervical or inguinal lymphadenopathy. Skin: No rashes, bruises or suspicious lesions. Neurologic: Grossly intact, no focal deficits, moving all 4 extremities. Psychiatric: Normal mood and affect.  Laboratory Data: Lab Results  Component Value Date   WBC 8.8 11/26/2019   HGB 15.2 11/26/2019   HCT 45.8 11/26/2019   MCV 95.4 11/26/2019   PLT 245 11/26/2019    Lab Results  Component Value Date   CREATININE 0.90 11/26/2019    No results found for: PSA  No results found for:  TESTOSTERONE  No results found for: HGBA1C  Urinalysis    Component Value Date/Time   COLORURINE YELLOW 11/26/2019 2003   APPEARANCEUR Clear 01/12/2024 1414   LABSPEC 1.005 11/26/2019 2003   PHURINE 6.0 11/26/2019 2003   GLUCOSEU Negative 01/12/2024 1414   HGBUR LARGE (A) 11/26/2019 2003   BILIRUBINUR Negative 01/12/2024 1414   KETONESUR NEGATIVE 11/26/2019 2003   PROTEINUR Negative 01/12/2024 1414   PROTEINUR NEGATIVE 11/26/2019 2003   UROBILINOGEN 0.2 01/17/2020 0904   NITRITE Negative 01/12/2024 1414   NITRITE NEGATIVE 11/26/2019 2003   LEUKOCYTESUR Trace (A) 01/12/2024 1414   LEUKOCYTESUR NEGATIVE 11/26/2019 2003    Lab Results  Component Value Date   LABMICR See below: 01/12/2024   WBCUA 0-5 01/12/2024  LABEPIT 0-10 01/12/2024   BACTERIA None seen 01/12/2024    Pertinent Imaging:  No results found for this or any previous visit.  No results found for this or any previous visit.  No results found for this or any previous visit.  No results found for this or any previous visit.  No results found for this or any previous visit.  No results found for this or any previous visit.  No results found for this or any previous visit.  No results found for this or any previous visit.   Assessment & Plan:    1. Prostate cancer (HCC) (Primary) Continue surveillance. Followup 6 months PS - Urinalysis, Routine w reflex microscopic   No follow-ups on file.  Belvie Clara, MD  [facility] [place]      [1]  Allergies Allergen Reactions   Bupropion     irritable   Hydrocodone -Acetaminophen      MIGRAINE   Oxycodone     MIGRAINE   Temazepam     HEADACHE   Zolpidem     HEADACHE   Latex Rash   Sulfa Antibiotics Rash   "

## 2024-08-18 NOTE — Progress Notes (Signed)
 Patient blood pressure value first attempt is high. A repeat blood pressure check completed.  Does patient have a PCP? Yes Has the patient taken then blood pressure medicine today? Yes  Discussed with patient the importance of reviewing their blood pressure with PCP and adhering to their blood medication is the patient is prescribed any. Patient voiced understanding.

## 2024-08-18 NOTE — Patient Instructions (Signed)

## 2025-02-22 ENCOUNTER — Other Ambulatory Visit

## 2025-02-28 ENCOUNTER — Ambulatory Visit: Admitting: Urology
